# Patient Record
Sex: Female | Born: 1985 | Race: White | Hispanic: No | Marital: Married | Smoking: Never smoker
Health system: Southern US, Community
[De-identification: ages and names within clinical notes are randomized; demographics above are authoritative.]

## PROBLEM LIST (undated history)

## (undated) DIAGNOSIS — R519 Headache, unspecified: Secondary | ICD-10-CM

## (undated) DIAGNOSIS — N390 Urinary tract infection, site not specified: Secondary | ICD-10-CM

## (undated) DIAGNOSIS — R197 Diarrhea, unspecified: Secondary | ICD-10-CM

## (undated) DIAGNOSIS — K219 Gastro-esophageal reflux disease without esophagitis: Secondary | ICD-10-CM

## (undated) DIAGNOSIS — G8929 Other chronic pain: Secondary | ICD-10-CM

## (undated) DIAGNOSIS — D333 Benign neoplasm of cranial nerves: Secondary | ICD-10-CM

## (undated) DIAGNOSIS — F32A Depression, unspecified: Secondary | ICD-10-CM

## (undated) DIAGNOSIS — R109 Unspecified abdominal pain: Secondary | ICD-10-CM

## (undated) DIAGNOSIS — F329 Major depressive disorder, single episode, unspecified: Secondary | ICD-10-CM

## (undated) DIAGNOSIS — Z9889 Other specified postprocedural states: Secondary | ICD-10-CM

## (undated) DIAGNOSIS — R1115 Cyclical vomiting syndrome unrelated to migraine: Secondary | ICD-10-CM

## (undated) DIAGNOSIS — R112 Nausea with vomiting, unspecified: Secondary | ICD-10-CM

## (undated) DIAGNOSIS — R51 Headache: Secondary | ICD-10-CM

---
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
## 2007-03-10 ENCOUNTER — Emergency Department: Payer: Self-pay | Admitting: Emergency Medicine

## 2007-04-16 ENCOUNTER — Ambulatory Visit: Payer: Self-pay | Admitting: Certified Nurse Midwife

## 2007-09-22 ENCOUNTER — Inpatient Hospital Stay: Payer: Self-pay

## 2008-02-12 ENCOUNTER — Emergency Department: Payer: Self-pay | Admitting: Emergency Medicine

## 2008-02-15 ENCOUNTER — Emergency Department: Payer: Self-pay | Admitting: Emergency Medicine

## 2008-07-10 ENCOUNTER — Emergency Department: Payer: Self-pay | Admitting: Emergency Medicine

## 2008-07-30 ENCOUNTER — Emergency Department: Payer: Self-pay | Admitting: Emergency Medicine

## 2008-08-09 ENCOUNTER — Ambulatory Visit: Payer: Self-pay | Admitting: Emergency Medicine

## 2008-08-15 ENCOUNTER — Emergency Department: Payer: Self-pay | Admitting: Internal Medicine

## 2008-10-25 ENCOUNTER — Ambulatory Visit: Payer: Self-pay | Admitting: Certified Nurse Midwife

## 2009-03-26 ENCOUNTER — Ambulatory Visit: Payer: Self-pay | Admitting: Certified Nurse Midwife

## 2009-03-27 ENCOUNTER — Inpatient Hospital Stay: Payer: Self-pay | Admitting: Obstetrics and Gynecology

## 2009-05-18 ENCOUNTER — Emergency Department: Payer: Self-pay | Admitting: Emergency Medicine

## 2009-06-06 ENCOUNTER — Emergency Department: Payer: Self-pay | Admitting: Emergency Medicine

## 2010-04-28 ENCOUNTER — Emergency Department: Payer: Self-pay | Admitting: Emergency Medicine

## 2010-05-28 ENCOUNTER — Emergency Department: Payer: Self-pay | Admitting: Emergency Medicine

## 2010-06-20 ENCOUNTER — Emergency Department: Payer: Self-pay | Admitting: Internal Medicine

## 2010-07-26 ENCOUNTER — Emergency Department: Payer: Self-pay | Admitting: Emergency Medicine

## 2010-08-27 ENCOUNTER — Ambulatory Visit: Payer: Self-pay | Admitting: Family Medicine

## 2010-09-02 ENCOUNTER — Emergency Department: Payer: Self-pay | Admitting: Emergency Medicine

## 2010-10-24 ENCOUNTER — Ambulatory Visit: Payer: Self-pay | Admitting: Gastroenterology

## 2011-02-01 ENCOUNTER — Emergency Department: Payer: Self-pay | Admitting: Emergency Medicine

## 2011-09-11 ENCOUNTER — Emergency Department: Payer: Self-pay | Admitting: Emergency Medicine

## 2011-09-11 LAB — CBC
HCT: 39 % (ref 35.0–47.0)
HGB: 13.1 g/dL (ref 12.0–16.0)
MCH: 30.5 pg (ref 26.0–34.0)
RDW: 13.7 % (ref 11.5–14.5)

## 2011-09-11 LAB — URINALYSIS, COMPLETE
Bacteria: NONE SEEN
Ketone: NEGATIVE
Leukocyte Esterase: NEGATIVE
Specific Gravity: 1.014 (ref 1.003–1.030)
Squamous Epithelial: 3
WBC UR: 1 /HPF (ref 0–5)

## 2011-09-11 LAB — HCG, QUANTITATIVE, PREGNANCY: Beta Hcg, Quant.: 93 m[IU]/mL — ABNORMAL HIGH

## 2011-09-12 ENCOUNTER — Emergency Department: Payer: Self-pay | Admitting: *Deleted

## 2011-09-12 LAB — URINALYSIS, COMPLETE

## 2011-09-12 LAB — COMPREHENSIVE METABOLIC PANEL
Albumin: 3.9 g/dL (ref 3.4–5.0)
Anion Gap: 8 (ref 7–16)
Bilirubin,Total: 0.3 mg/dL (ref 0.2–1.0)
Co2: 28 mmol/L (ref 21–32)
EGFR (Non-African Amer.): >60
Glucose: 74 mg/dL (ref 65–99)
Osmolality: 280 (ref 275–301)
Potassium: 4.3 mmol/L (ref 3.5–5.1)
SGOT(AST): 26 U/L (ref 15–37)
SGPT (ALT): 26 U/L
Total Protein: 7.6 g/dL (ref 6.4–8.2)

## 2011-09-12 LAB — CBC
HCT: 38.7 % (ref 35.0–47.0)
HGB: 13 g/dL (ref 12.0–16.0)
MCHC: 33.7 g/dL (ref 32.0–36.0)
MCV: 91 fL (ref 80–100)
RDW: 13.8 % (ref 11.5–14.5)
WBC: 5.5 10*3/uL (ref 3.6–11.0)

## 2011-09-27 ENCOUNTER — Emergency Department: Payer: Self-pay | Admitting: *Deleted

## 2011-09-27 LAB — CBC WITH DIFFERENTIAL/PLATELET
Basophil #: 0 10*3/uL (ref 0.0–0.1)
Basophil %: 0.3 %
Eosinophil %: 0.6 %
MCV: 91 fL (ref 80–100)
Monocyte #: 0.6 x10 3/mm (ref 0.2–0.9)
Monocyte %: 4.6 %
Neutrophil #: 10.8 10*3/uL — ABNORMAL HIGH (ref 1.4–6.5)
RBC: 4.38 10*6/uL (ref 3.80–5.20)
RDW: 13.9 % (ref 11.5–14.5)
WBC: 13.2 10*3/uL — ABNORMAL HIGH (ref 3.6–11.0)

## 2011-09-27 LAB — COMPREHENSIVE METABOLIC PANEL
Albumin: 4 g/dL (ref 3.4–5.0)
Alkaline Phosphatase: 57 U/L (ref 50–136)
Chloride: 106 mmol/L (ref 98–107)
Co2: 24 mmol/L (ref 21–32)
EGFR (African American): >60
EGFR (Non-African Amer.): >60
Osmolality: 281 (ref 275–301)
SGOT(AST): 23 U/L (ref 15–37)
Total Protein: 7.2 g/dL (ref 6.4–8.2)

## 2011-09-27 LAB — LIPASE, BLOOD: Lipase: 113 U/L (ref 73–393)

## 2012-04-28 ENCOUNTER — Emergency Department: Payer: Self-pay | Admitting: Unknown Physician Specialty

## 2012-04-30 LAB — BETA STREP CULTURE(ARMC)

## 2013-07-11 ENCOUNTER — Emergency Department: Payer: Self-pay | Admitting: Emergency Medicine

## 2013-10-04 ENCOUNTER — Emergency Department: Payer: Self-pay | Admitting: Emergency Medicine

## 2014-05-10 ENCOUNTER — Emergency Department: Payer: Self-pay | Admitting: Emergency Medicine

## 2014-08-21 ENCOUNTER — Ambulatory Visit: Payer: Self-pay | Admitting: Physician Assistant

## 2014-08-28 ENCOUNTER — Emergency Department: Payer: Self-pay | Admitting: Emergency Medicine

## 2014-09-24 ENCOUNTER — Emergency Department
Admit: 2014-09-24 | Disposition: A | Discharge status: Home / Self Care | Payer: Self-pay | Admitting: Emergency Medicine

## 2015-01-02 ENCOUNTER — Emergency Department
Admission: EM | Admit: 2015-01-02 | Discharge: 2015-01-02 | Disposition: A | Discharge status: Home / Self Care | Payer: Medicaid Other | Attending: Emergency Medicine | Admitting: Emergency Medicine

## 2015-01-02 ENCOUNTER — Encounter: Payer: Self-pay | Admitting: Emergency Medicine

## 2015-01-02 DIAGNOSIS — K529 Noninfective gastroenteritis and colitis, unspecified: Secondary | ICD-10-CM | POA: Insufficient documentation

## 2015-01-02 DIAGNOSIS — R197 Diarrhea, unspecified: Secondary | ICD-10-CM | POA: Diagnosis present

## 2015-01-02 MED ORDER — ONDANSETRON 8 MG PO TBDP
8.0000 mg | ORAL_TABLET | Freq: Once | ORAL | Status: AC
  Administered 2015-01-02: 8 mg via ORAL

## 2015-01-02 MED ORDER — ONDANSETRON 8 MG PO TBDP
ORAL_TABLET | ORAL | Status: AC
  Administered 2015-01-02: 8 mg via ORAL
  Filled 2015-01-02: qty 1

## 2015-01-02 MED ORDER — ONDANSETRON HCL 4 MG PO TABS: 4.0000 mg | ORAL_TABLET | Freq: Every day | ORAL | Status: DC | PRN

## 2015-01-02 NOTE — ED Provider Notes (Signed)
[facility] Emergency Department Provider Note  ____________________________________________  Time seen: On arrival  I have reviewed the triage vital signs and the nursing notes.   HISTORY  Chief Complaint Emesis; Diarrhea; and Abdominal Pain    HPI Kathryn Valencia is a 29 y.o. female who reports she developed nausea and vomiting yesterday evening and then progressed to diarrhea overnight. She complains of mild abdominal cramping that is intermittent. She has chills but has not had a fever that she knows of. No bloody vomitus no blood in her stool. She reports her husband has similar symptoms over the last couple of days. No recent travel.She is tolerating liquids     History reviewed. No pertinent past medical history.  There are no active problems to display for this patient.   History reviewed. No pertinent past surgical history.  Current Outpatient Rx  Name  Route  Sig  Dispense  Refill  . ondansetron (ZOFRAN) 4 MG tablet   Oral   Take 1 tablet (4 mg total) by mouth daily as needed for nausea or vomiting.   20 tablet   1     Allergies Review of patient's allergies indicates no known allergies.  No family history on file.  Social History History  Substance Use Topics  . Smoking status: Never Smoker   . Smokeless tobacco: Not on file  . Alcohol Use: No    Review of Systems  Constitutional: Negative for fever. Positive for chills Eyes: Negative for visual changes. ENT: Negative for sore throat Cardiovascular: Negative for chest pain. Respiratory: Negative for shortness of breath. Gastrointestinal: As above Genitourinary: Negative for dysuria. Musculoskeletal: Negative for back pain. Skin: Negative for rash. Neurological: Negative for headaches or focal weakness Psychiatric: No anxiety  10-point ROS otherwise negative.  ____________________________________________   PHYSICAL EXAM:  VITAL SIGNS: ED Triage Vitals  Enc  Vitals Group     BP 01/02/15 0650 136/98 mmHg     Pulse Rate 01/02/15 0650 75     Resp 01/02/15 0650 18     Temp 01/02/15 0650 98.2 F (36.8 C)     Temp Source 01/02/15 0650 Oral     SpO2 01/02/15 0650 97 %     Weight 01/02/15 0650 260 lb (117.935 kg)     Height 01/02/15 0650 5\' 7"  (1.702 m)     Head Cir --      Peak Flow --      Pain Score 01/02/15 0652 10     Pain Loc --      Pain Edu? --      Excl. in [place]? --      Constitutional: Alert and oriented. Well appearing and in no distress. Eyes: Conjunctivae are normal.  ENT   Head: Normocephalic and atraumatic.   Mouth/Throat: Mucous membranes are moist. Cardiovascular: Normal rate, regular rhythm. Normal and symmetric distal pulses are present in all extremities. No murmurs, rubs, or gallops. Respiratory: Normal respiratory effort without tachypnea nor retractions. Breath sounds are clear and equal bilaterally.  Gastrointestinal: Soft and non-tender in all quadrants. No distention. There is no CVA tenderness. Genitourinary: deferred Musculoskeletal: Nontender with normal range of motion in all extremities. No lower extremity tenderness nor edema. Neurologic:  Normal speech and language. No gross focal neurologic deficits are appreciated. Skin:  Skin is warm, dry and intact. No rash noted. Psychiatric: Mood and affect are normal. Patient exhibits appropriate insight and judgment.  ____________________________________________    LABS (pertinent positives/negatives)  Labs Reviewed - No data to  display  ____________________________________________   EKG  None  ____________________________________________    RADIOLOGY I have personally reviewed any xrays that were ordered on this patient: None  ____________________________________________   PROCEDURES  Procedure(s) performed: none  Critical Care performed: none  ____________________________________________   INITIAL IMPRESSION / ASSESSMENT AND PLAN /  ED COURSE  Pertinent labs & imaging results that were available during my care of the patient were reviewed by me and considered in my medical decision making (see chart for details).  Patient well-appearing with normal vitals. No tenderness to palpation. History of present illness most consistent with viral gastroenteritis. We will give 8 of Zofran ODT and discharge home with instructions for supportive care. Return precautions discussed  ____________________________________________   FINAL CLINICAL IMPRESSION(S) / ED DIAGNOSES  Final diagnoses:  Gastroenteritis     Lavonia Drafts, MD 01/02/15 0730

## 2015-01-02 NOTE — Discharge Instructions (Signed)

## 2015-01-02 NOTE — ED Notes (Signed)
Patient ambulatory to triage with steady gait, without difficulty or distress noted; pt reports N/V/D since last night

## 2015-01-02 NOTE — ED Notes (Signed)
MD at bedside. 

## 2015-01-08 DIAGNOSIS — D333 Benign neoplasm of cranial nerves: Secondary | ICD-10-CM

## 2015-01-08 HISTORY — DX: Benign neoplasm of cranial nerves: D33.3

## 2015-01-11 ENCOUNTER — Other Ambulatory Visit: Payer: Self-pay | Admitting: Otolaryngology

## 2015-01-11 DIAGNOSIS — H903 Sensorineural hearing loss, bilateral: Secondary | ICD-10-CM

## 2015-01-11 DIAGNOSIS — H905 Unspecified sensorineural hearing loss: Secondary | ICD-10-CM

## 2015-01-18 ENCOUNTER — Ambulatory Visit
Admission: RE | Admit: 2015-01-18 | Discharge: 2015-01-18 | Disposition: A | Discharge status: Home / Self Care | Payer: Medicaid Other | Source: Ambulatory Visit | Attending: Otolaryngology | Admitting: Otolaryngology

## 2015-01-18 DIAGNOSIS — H9122 Sudden idiopathic hearing loss, left ear: Secondary | ICD-10-CM | POA: Diagnosis present

## 2015-01-18 DIAGNOSIS — D333 Benign neoplasm of cranial nerves: Secondary | ICD-10-CM | POA: Diagnosis not present

## 2015-01-18 DIAGNOSIS — H903 Sensorineural hearing loss, bilateral: Secondary | ICD-10-CM

## 2015-01-18 DIAGNOSIS — H905 Unspecified sensorineural hearing loss: Secondary | ICD-10-CM

## 2015-01-18 MED ORDER — GADOBENATE DIMEGLUMINE 529 MG/ML IV SOLN
20.0000 mL | Freq: Once | INTRAVENOUS | Status: AC | PRN
  Administered 2015-01-18: 20 mL via INTRAVENOUS

## 2015-01-27 ENCOUNTER — Emergency Department
Admission: EM | Admit: 2015-01-27 | Discharge: 2015-01-27 | Disposition: A | Discharge status: Home / Self Care | Payer: Medicaid Other | Attending: Emergency Medicine | Admitting: Emergency Medicine

## 2015-01-27 ENCOUNTER — Other Ambulatory Visit: Payer: Self-pay

## 2015-01-27 DIAGNOSIS — G43909 Migraine, unspecified, not intractable, without status migrainosus: Secondary | ICD-10-CM | POA: Insufficient documentation

## 2015-01-27 DIAGNOSIS — G43009 Migraine without aura, not intractable, without status migrainosus: Secondary | ICD-10-CM

## 2015-01-27 DIAGNOSIS — Z3202 Encounter for pregnancy test, result negative: Secondary | ICD-10-CM | POA: Insufficient documentation

## 2015-01-27 DIAGNOSIS — R51 Headache: Secondary | ICD-10-CM | POA: Diagnosis present

## 2015-01-27 HISTORY — DX: Other chronic pain: G89.29

## 2015-01-27 HISTORY — DX: Morbid (severe) obesity due to excess calories: E66.01

## 2015-01-27 HISTORY — DX: Benign neoplasm of cranial nerves: D33.3

## 2015-01-27 HISTORY — DX: Headache: R51

## 2015-01-27 HISTORY — DX: Headache, unspecified: R51.9

## 2015-01-27 LAB — URINALYSIS COMPLETE WITH MICROSCOPIC (ARMC ONLY)
Bilirubin Urine: NEGATIVE
GLUCOSE, UA: NEGATIVE mg/dL
KETONES UR: NEGATIVE mg/dL
NITRITE: NEGATIVE
Protein, ur: NEGATIVE mg/dL
SPECIFIC GRAVITY, URINE: 1.013 (ref 1.005–1.030)
pH: 6 (ref 5.0–8.0)

## 2015-01-27 LAB — BASIC METABOLIC PANEL
ANION GAP: 8 (ref 5–15)
BUN: 7 mg/dL (ref 6–20)
CALCIUM: 9 mg/dL (ref 8.9–10.3)
CO2: 21 mmol/L — ABNORMAL LOW (ref 22–32)
Chloride: 108 mmol/L (ref 101–111)
Creatinine, Ser: 0.72 mg/dL (ref 0.44–1.00)
GFR calc Af Amer: >60 mL/min (ref >60)
GLUCOSE: 98 mg/dL (ref 65–99)
Potassium: 3.3 mmol/L — ABNORMAL LOW (ref 3.5–5.1)
SODIUM: 137 mmol/L (ref 135–145)

## 2015-01-27 LAB — CBC
HCT: 39.7 % (ref 35.0–47.0)
HEMOGLOBIN: 13.5 g/dL (ref 12.0–16.0)
MCH: 30 pg (ref 26.0–34.0)
MCHC: 34 g/dL (ref 32.0–36.0)
MCV: 88.4 fL (ref 80.0–100.0)
Platelets: 208 10*3/uL (ref 150–440)
RBC: 4.5 MIL/uL (ref 3.80–5.20)
RDW: 13.7 % (ref 11.5–14.5)
WBC: 5.8 10*3/uL (ref 3.6–11.0)

## 2015-01-27 LAB — POCT PREGNANCY, URINE: Preg Test, Ur: NEGATIVE

## 2015-01-27 MED ORDER — DEXAMETHASONE SODIUM PHOSPHATE 10 MG/ML IJ SOLN
INTRAMUSCULAR | Status: AC
  Filled 2015-01-27: qty 1

## 2015-01-27 MED ORDER — DEXAMETHASONE SODIUM PHOSPHATE 10 MG/ML IJ SOLN
10.0000 mg | Freq: Once | INTRAMUSCULAR | Status: AC
  Administered 2015-01-27: 10 mg via INTRAVENOUS

## 2015-01-27 MED ORDER — METOCLOPRAMIDE HCL 5 MG/ML IJ SOLN
INTRAMUSCULAR | Status: AC
  Filled 2015-01-27: qty 2

## 2015-01-27 MED ORDER — METOCLOPRAMIDE HCL 5 MG/ML IJ SOLN
20.0000 mg | Freq: Once | INTRAVENOUS | Status: AC
  Administered 2015-01-27: 20 mg via INTRAVENOUS
  Filled 2015-01-27: qty 4

## 2015-01-27 MED ORDER — SODIUM CHLORIDE 0.9 % IV BOLUS (SEPSIS)
500.0000 mL | INTRAVENOUS | Status: AC
  Administered 2015-01-27: 500 mL via INTRAVENOUS

## 2015-01-27 MED ORDER — KETOROLAC TROMETHAMINE 30 MG/ML IJ SOLN
INTRAMUSCULAR | Status: AC
  Filled 2015-01-27: qty 1

## 2015-01-27 MED ORDER — KETOROLAC TROMETHAMINE 30 MG/ML IJ SOLN
30.0000 mg | Freq: Once | INTRAMUSCULAR | Status: AC
  Administered 2015-01-27: 30 mg via INTRAVENOUS

## 2015-01-27 MED ORDER — DIPHENHYDRAMINE HCL 50 MG/ML IJ SOLN
INTRAMUSCULAR | Status: AC
  Filled 2015-01-27: qty 1

## 2015-01-27 MED ORDER — DIPHENHYDRAMINE HCL 50 MG/ML IJ SOLN
25.0000 mg | Freq: Once | INTRAMUSCULAR | Status: AC
  Administered 2015-01-27: 25 mg via INTRAVENOUS

## 2015-01-27 NOTE — ED Notes (Signed)
Family at bedside. 

## 2015-01-27 NOTE — ED Notes (Signed)
Pt is complaining of headache, dizziness, ringing in ear on and off for about a month. 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

## 2015-01-27 NOTE — Discharge Instructions (Signed)
You have been seen in the Emergency Department (ED) for a we believe to be a migraine and not related to the small tumor it was recently found by Dr. Pryor Ochoa.  Please use Tylenol or Motrin as needed for symptoms, but only as written on the box, and take any regular medications that have been prescribed for you. As we have discussed, please follow up with your doctor as soon as possible regarding todays ED visit and your headache symptoms.    Call your doctor or return to the Emergency Department (ED) if you have a worsening headache, sudden and severe headache, confusion, slurred speech, facial droop, weakness or numbness in any arm or leg, extreme fatigue, or other symptoms that concern you.   Migraine Headache A migraine headache is very bad, throbbing pain on one or both sides of your head. Talk to your doctor about what things may bring on (trigger) your migraine headaches. HOME CARE  Only take medicines as told by your doctor.  Lie down in a dark, quiet room when you have a migraine.  Keep a journal to find out if certain things bring on migraine headaches. For example, write down:  What you eat and drink.  How much sleep you get.  Any change to your diet or medicines.  Lessen how much alcohol you drink.  Quit smoking if you smoke.  Get enough sleep.  Lessen any stress in your life.  Keep lights dim if bright lights bother you or make your migraines worse. GET HELP RIGHT AWAY IF:   Your migraine becomes really bad.  You have a fever.  You have a stiff neck.  You have trouble seeing.  Your muscles are weak, or you lose muscle control.  You lose your balance or have trouble walking.  You feel like you will pass out (faint), or you pass out.  You have really bad symptoms that are different than your first symptoms. MAKE SURE YOU:   Understand these instructions.  Will watch your condition.  Will get help right away if you are not doing well or get  worse. Document Released: 03/04/2008 Document Revised: 08/18/2011 Document Reviewed: 01/31/2013 [facility] Patient Information 2015 [place], [place]. This information is not intended to replace advice given to you by your health care provider. Make sure you discuss any questions you have with your health care provider.

## 2015-01-27 NOTE — ED Provider Notes (Signed)
[facility] Emergency Department [place] Note  ____________________________________________  Time seen: Approximately 6:04 PM  I have reviewed the triage vital signs and the nursing notes.   HISTORY  Chief Complaint Headache    HPI Kathryn Valencia is a 29 y.o. female with a medical history of obesity, chronic headaches, and recent diagnosis of an acoustic neuroma who presents with worsening left-sided headache.  She states that she has had a headache on and off for about a month as well as ringing in her left ear and occasional dizziness.  She was seen by Dr. Pryor Ochoa with ENT who ordered an MRI for her as an outpatient 9 days ago in which revealeda vestibular schwannoma/acoustic neuroma.  It is 4 x 8 mm.  I asked the patient what her follow-up plan is and she stated that they are referring her to a doctor in [place].  The pain has been constant, gradual in onset, and waxes and wanes in intensity from mild to severe.  She has not had any difficulty with her vision but does occasionally feel pain in her left eye as well.  SHe has had no numbness or tingling in her extremities.  She denies nausea, vomiting, chest pain, shortness of breath, abdominal pain, and dysuria.  Past Medical History  Diagnosis Date  . Morbid obesity   . 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 Chronic headaches     There are no active problems to display for this patient.   History reviewed. No pertinent past surgical history.  Current Outpatient Rx  Name  Route  Sig  Dispense  Refill  . ondansetron (ZOFRAN) 4 MG tablet   Oral   Take 1 tablet (4 mg total) by mouth daily as needed for nausea or vomiting.   20 tablet   1     Allergies Review of patient's allergies indicates no known allergies.  History reviewed. No pertinent family history.  Social History Social History  Substance Use Topics  . Smoking status: Never Smoker   . Smokeless tobacco: None  . Alcohol Use: No    Review of  Systems Constitutional: No fever/chills Eyes: No visual changes.occasional pain in her left eye ENT: No sore throat. Cardiovascular: Denies chest pain. Respiratory: Denies shortness of breath. Gastrointestinal: No abdominal pain.  No nausea, no vomiting.  No diarrhea.  No constipation. Genitourinary: Negative for dysuria. Musculoskeletal: Negative for back pain. Skin: Negative for rash. Neurological: persistent left-sided headache for about one month with tinnitus in her left ear  10-point ROS otherwise negative.  ____________________________________________   PHYSICAL EXAM:  VITAL SIGNS: ED Triage Vitals  Enc Vitals Group     BP 01/27/15 1332 109/69 mmHg     Pulse Rate 01/27/15 1332 72     Resp 01/27/15 1332 16     Temp 01/27/15 1332 97.5 F (36.4 C)     Temp Source 01/27/15 1332 Oral     SpO2 01/27/15 1332 99 %     Weight 01/27/15 1332 273 lb (123.832 kg)     Height 01/27/15 1332 5\' 7"  (1.702 m)     Head Cir --      Peak Flow --      Pain Score 01/27/15 1333 10     Pain Loc --      Pain Edu? --      Excl. in [place]? --     Constitutional: Alert and oriented. Well appearing and in no acute distress. Eyes: Conjunctivae are normal. PERRL. EOMI.  funduscopic exam is  normal with no evidence of papilledema Head: Atraumatic. Nose: No congestion/rhinnorhea. Mouth/Throat: Mucous membranes are moist.  Oropharynx non-erythematous. Neck: No stridor.   Cardiovascular: Normal rate, regular rhythm. Grossly normal heart sounds.  Good peripheral circulation. Respiratory: Normal respiratory effort.  No retractions. Lungs CTAB. Gastrointestinal: Soft and nontender. No distention. No abdominal bruits. No CVA tenderness. Musculoskeletal: No lower extremity tenderness nor edema.  No joint effusions. Neurologic:  Normal speech and language. No gross focal neurologic deficits are appreciated.  Skin:  Skin is warm, dry and intact. No rash noted. Psychiatric: Mood and affect are normal. Speech  and behavior are normal.  ____________________________________________   LABS (all labs ordered are listed, but only abnormal results are displayed)  Labs Reviewed  BASIC METABOLIC PANEL - Abnormal; Notable for the following:    Potassium 3.3 (*)    CO2 21 (*)    All other components within normal limits  URINALYSIS COMPLETEWITH MICROSCOPIC ([facility] ONLY) - Abnormal; Notable for the following:    Color, Urine YELLOW (*)    APPearance HAZY (*)    Hgb urine dipstick 1+ (*)    Leukocytes, UA 2+ (*)    Bacteria, UA RARE (*)    Squamous Epithelial / LPF 6-30 (*)    All other components within normal limits  CBC  POC URINE PREG, ED  POCT PREGNANCY, URINE   ____________________________________________  EKG  Not indicated ____________________________________________  RADIOLOGY  Not indicated ____________________________________________   PROCEDURES  Procedure(s) performed: None  Critical Care performed: No ____________________________________________   INITIAL IMPRESSION / ASSESSMENT AND PLAN / ED COURSE  Pertinent labs & imaging results that were available during my care of the patient were reviewed by me and considered in my medical decision making (see chart for details).  The patient seems to be having acute on chronic headache/migraine.  I discussed the findings on the MRI by phone with Dr. Tami Ribas who does not feel that the acoustic neuromas likely to be causing her headache.  Given her history of chronic headaches and the fact that this sounds very much like a typical migraine I will treat her for migraine with my usual migraine cocktail.  Though she is obese, I appreciate no papilledema on funduscopic exam and I do not believe that the risks outweigh the benefits in terms of doing a lumbar puncture to evaluate for pseudotumor cerebri.  I will reassess after treatment.  ----------------------------------------- 8:08 PM on  01/27/2015 -----------------------------------------  The patient states that she is feeling much better and that her headache is gone.  I am reassured by this and am even more strongly suspicious that this is chronic migraines.  I discussed this with her and recommended close outpatient follow-up.  I gave her my usual and customary return precautions.  ____________________________________________  FINAL CLINICAL IMPRESSION(S) / ED DIAGNOSES  Final diagnoses:  Nonintractable migraine, unspecified migraine type      NEW MEDICATIONS STARTED DURING THIS VISIT:  New Prescriptions   No medications on file     Hinda Kehr, MD 01/27/15 2011

## 2015-01-27 NOTE — ED Notes (Signed)
Pt is complaining of headache, dizziness, ringing in ear on and off for about a month. Patient states that she was "diagnosed with Aloustic Neuromd

## 2015-06-07 ENCOUNTER — Emergency Department
Admission: EM | Admit: 2015-06-07 | Discharge: 2015-06-07 | Disposition: A | Discharge status: Home / Self Care | Payer: Medicaid Other | Attending: Emergency Medicine | Admitting: Emergency Medicine

## 2015-06-07 DIAGNOSIS — R05 Cough: Secondary | ICD-10-CM | POA: Diagnosis present

## 2015-06-07 DIAGNOSIS — Z79899 Other long term (current) drug therapy: Secondary | ICD-10-CM | POA: Insufficient documentation

## 2015-06-07 DIAGNOSIS — J029 Acute pharyngitis, unspecified: Secondary | ICD-10-CM | POA: Insufficient documentation

## 2015-06-07 DIAGNOSIS — J069 Acute upper respiratory infection, unspecified: Secondary | ICD-10-CM | POA: Insufficient documentation

## 2015-06-07 MED ORDER — AMOXICILLIN 500 MG PO TABS: 500.0000 mg | ORAL_TABLET | Freq: Three times a day (TID) | ORAL | Status: DC

## 2015-06-07 MED ORDER — PREDNISONE 10 MG (21) PO TBPK: ORAL_TABLET | ORAL | Status: DC

## 2015-06-07 NOTE — ED Notes (Signed)
Pt c/o cough and congestion for the past 2 weeks, states she has been taking OTC meds without any relief

## 2015-06-07 NOTE — Discharge Instructions (Signed)

## 2015-06-07 NOTE — ED Notes (Signed)
Cold and cough for about 2 weeks ..having some discomfort to chest with cough and inspiration

## 2015-06-07 NOTE — ED Provider Notes (Signed)
[facility] Emergency Department Provider Note ____________________________________________  Time seen: Approximately 2:57 PM  I have reviewed the triage vital signs and the nursing notes.   HISTORY  Chief Complaint URI   HPI Kathryn Valencia is a 29 y.o. female who presents to the emergency department for evaluation of URI. Symptoms started about 2 weeks ago. No relief with OTC cold medications.    Past Medical History  Diagnosis Date  . Morbid obesity ([place])   . Acoustic neuroma ([place]) 01/2015  . Chronic headaches     There are no active problems to display for this patient.   History reviewed. No pertinent past surgical history.  Current Outpatient Rx  Name  Route  Sig  Dispense  Refill  . esomeprazole (NEXIUM) 40 MG capsule   Oral   Take 40 mg by mouth daily at 12 noon.         Marland Kitchen amoxicillin (AMOXIL) 500 MG tablet   Oral   Take 1 tablet (500 mg total) by mouth 3 (three) times daily.   30 tablet   0   . ondansetron (ZOFRAN) 4 MG tablet   Oral   Take 1 tablet (4 mg total) by mouth daily as needed for nausea or vomiting.   20 tablet   1   . predniSONE (STERAPRED UNI-PAK 21 TAB) 10 MG (21) TBPK tablet      Take 6 tablets on day 1 Take 5 tablets on day 2 Take 4 tablets on day 3 Take 3 tablets on day 4 Take 2 tablets on day 5 Take 1 tablet on day 6   21 tablet   0     Allergies Review of patient's allergies indicates no known allergies.  No family history on file.  Social History Social History  Substance Use Topics  . Smoking status: Never Smoker   . Smokeless tobacco: None  . Alcohol Use: No    Review of Systems Constitutional: No fever/chills Eyes: No visual changes. ENT: No sore throat. Cardiovascular: Denies chest pain. Respiratory: Occasional shortness of breath. Positive for cough. Gastrointestinal: No abdominal pain. No nausea,  no vomiting.  No diarrhea.  Genitourinary: Negative for dysuria. Musculoskeletal:  Negative for body aches Skin: Negative for rash. Neurological: Negative for headaches, Negative for focal weakness or numbness.  10-point ROS otherwise negative.  ____________________________________________   PHYSICAL EXAM:  VITAL SIGNS: ED Triage Vitals  Enc Vitals Group     BP 06/07/15 1049 113/59 mmHg     Pulse Rate 06/07/15 1049 79     Resp 06/07/15 1049 18     Temp 06/07/15 1049 98.4 F (36.9 C)     Temp Source 06/07/15 1049 Oral     SpO2 06/07/15 1049 98 %     Weight 06/07/15 1049 250 lb (113.399 kg)     Height 06/07/15 1049 5\' 7"  (1.702 m)     Head Cir --      Peak Flow --      Pain Score 06/07/15 1055 6     Pain Loc --      Pain Edu? --      Excl. in [place]? --     Constitutional: Alert and oriented. Well appearing and in no acute distress. Eyes: Conjunctivae are normal. PERRL. EOMI. Ears: Serous OM bilaterally. Head: Atraumatic. Nose: No congestion/rhinnorhea. Mouth/Throat: Mucous membranes are moist.  Oropharynx non-erythematous. Neck: No stridor.  Lymphatic: No cervical lymphadenopathy. Cardiovascular: Normal rate, regular rhythm. Grossly normal heart sounds.  Good peripheral circulation.  Respiratory: Normal respiratory effort.  No retractions. Lungs clear to auscultation. Gastrointestinal: Soft and nontender. No distention. No abdominal bruits. No CVA tenderness. Musculoskeletal: No joint pain reported. Neurologic:  Normal speech and language. No gross focal neurologic deficits are appreciated. Speech is normal. No gait instability. Skin:  Skin is warm, dry and intact. No rash noted. Psychiatric: Mood and affect are normal. Speech and behavior are normal.  ____________________________________________   LABS (all labs ordered are listed, but only abnormal results are displayed)  Labs Reviewed - No data to display ____________________________________________  EKG   ____________________________________________  RADIOLOGY  Not  indicated. ____________________________________________   PROCEDURES  Procedure(s) performed: None  Critical Care performed: No  ____________________________________________   INITIAL IMPRESSION / ASSESSMENT AND PLAN / ED COURSE  Pertinent labs & imaging results that were available during my care of the patient were reviewed by me and considered in my medical decision making (see chart for details).  Patient was advised to take the amoxicillin and prednisone until finished as prescribed. She was advised to follow up with the PCP of choice for symptoms that are not improving over the next few days. She was advised to return to the ER for symptoms that change or worsen if unable to schedule an appointment.  ____________________________________________   FINAL CLINICAL IMPRESSION(S) / ED DIAGNOSES  Final diagnoses:  Acute upper respiratory infection  Acute pharyngitis, unspecified etiology       Victorino Dike, FNP 06/07/15 1506  Nena Polio, MD 06/07/15 [phone]

## 2015-09-18 ENCOUNTER — Other Ambulatory Visit: Payer: Self-pay | Admitting: Physician Assistant

## 2015-09-18 ENCOUNTER — Ambulatory Visit
Admission: RE | Admit: 2015-09-18 | Discharge: 2015-09-18 | Disposition: A | Discharge status: Home / Self Care | Payer: Medicaid Other | Source: Ambulatory Visit | Attending: Physician Assistant | Admitting: Physician Assistant

## 2015-09-18 DIAGNOSIS — M79671 Pain in right foot: Secondary | ICD-10-CM | POA: Diagnosis not present

## 2015-09-18 DIAGNOSIS — T1490 Injury, unspecified: Secondary | ICD-10-CM | POA: Insufficient documentation

## 2015-09-18 DIAGNOSIS — W1830XA Fall on same level, unspecified, initial encounter: Secondary | ICD-10-CM | POA: Insufficient documentation

## 2015-10-01 ENCOUNTER — Encounter: Payer: Self-pay | Admitting: Emergency Medicine

## 2015-10-01 ENCOUNTER — Emergency Department
Admission: EM | Admit: 2015-10-01 | Discharge: 2015-10-01 | Disposition: A | Discharge status: Home / Self Care | Payer: Medicaid Other | Attending: Emergency Medicine | Admitting: Emergency Medicine

## 2015-10-01 DIAGNOSIS — W19XXXA Unspecified fall, initial encounter: Secondary | ICD-10-CM | POA: Insufficient documentation

## 2015-10-01 DIAGNOSIS — Z7952 Long term (current) use of systemic steroids: Secondary | ICD-10-CM | POA: Diagnosis not present

## 2015-10-01 DIAGNOSIS — S93601A Unspecified sprain of right foot, initial encounter: Secondary | ICD-10-CM

## 2015-10-01 DIAGNOSIS — D333 Benign neoplasm of cranial nerves: Secondary | ICD-10-CM | POA: Diagnosis not present

## 2015-10-01 DIAGNOSIS — Z792 Long term (current) use of antibiotics: Secondary | ICD-10-CM | POA: Insufficient documentation

## 2015-10-01 DIAGNOSIS — Y929 Unspecified place or not applicable: Secondary | ICD-10-CM | POA: Insufficient documentation

## 2015-10-01 DIAGNOSIS — M79671 Pain in right foot: Secondary | ICD-10-CM | POA: Diagnosis present

## 2015-10-01 DIAGNOSIS — Y939 Activity, unspecified: Secondary | ICD-10-CM | POA: Insufficient documentation

## 2015-10-01 DIAGNOSIS — Y999 Unspecified external cause status: Secondary | ICD-10-CM | POA: Insufficient documentation

## 2015-10-01 DIAGNOSIS — S93601S Unspecified sprain of right foot, sequela: Secondary | ICD-10-CM

## 2015-10-01 NOTE — ED Notes (Signed)
Pt was seen here after a fall a few weeks ago, injury to right foot, states the pain is still intense, ibuprofen is not helping with the pain per pt.

## 2015-10-01 NOTE — Discharge Instructions (Signed)
Cryotherapy Cryotherapy is when you put ice on your injury. Ice helps lessen pain and puffiness (swelling) after an injury. Ice works the best when you start using it in the first 24 to 48 hours after an injury. HOME CARE  Put a dry or damp towel between the ice pack and your skin.  You may press gently on the ice pack.  Leave the ice on for no more than 10 to 20 minutes at a time.  Check your skin after 5 minutes to make sure your skin is okay.  Rest at least 20 minutes between ice pack uses.  Stop using ice when your skin loses feeling (numbness).  Do not use ice on someone who cannot tell you when it hurts. This includes small children and people with memory problems (dementia). GET HELP RIGHT AWAY IF:  You have white spots on your skin.  Your skin turns blue or pale.  Your skin feels waxy or hard.  Your puffiness gets worse. MAKE SURE YOU:   Understand these instructions.  Will watch your condition.  Will get help right away if you are not doing well or get worse.   This information is not intended to replace advice given to you by your health care provider. Make sure you discuss any questions you have with your health care provider.   Document Released: 11/12/2007 Document Revised: 08/18/2011 Document Reviewed: 01/16/2011 Elsevier Interactive Patient Education 2016 [place] Sprain A foot sprain is an injury to one of the strong bands of tissue (ligaments) that connect and support the many bones in your feet. The ligament can be stretched too much or it can tear. A tear can be either partial or complete. The severity of the sprain depends on how much of the ligament was damaged or torn. CAUSES A foot sprain is usually caused by suddenly twisting or pivoting your foot. RISK FACTORS This injury is more likely to occur in people who:  Play a sport, such as basketball or football.  Exercise or play a sport without warming up.  Start a new workout or  sport.  Suddenly increase how long or hard they exercise or play a sport. SYMPTOMS Symptoms of this condition start soon after an injury and include:  Pain, especially in the arch of the foot.  Bruising.  Swelling.  Inability to walk or use the foot to support body weight. DIAGNOSIS This condition is diagnosed with a medical history and physical exam. You may also have imaging tests, such as:  X-rays to make sure there are no broken bones (fractures).  MRI to see if the ligament has torn. TREATMENT Treatment varies depending on the severity of your sprain. Mild sprains can be treated with rest, ice, compression, and elevation (RICE). If your ligament is overstretched or partially torn, treatment usually involves keeping your foot in a fixed position (immobilization) for a period of time. To help you do this, your health care provider will apply a bandage, splint, or walking boot to keep your foot from moving until it heals. You may also be advised to use crutches or a scooter for a few weeks to avoid bearing weight on your foot while it is healing. If your ligament is fully torn, you may need surgery to reconnect the ligament to the bone. After surgery, a cast or splint will be applied and will need to stay on your foot while it heals. Your health care provider may also suggest exercises or physical therapy to strengthen  your foot. HOME CARE INSTRUCTIONS If You Have a Bandage, Splint, or Walking Boot:  Wear it as directed by your health care provider. Remove it only as directed by your health care provider.  Loosen the bandage, splint, or walking boot if your toes become numb and tingle, or if they turn cold and blue. Bathing  If your health care provider approves bathing and showering, cover the bandage or splint with a watertight plastic bag to protect it from water. Do not let the bandage or splint get wet. Managing Pain, Stiffness, and Swelling   If directed, apply ice to the  injured area:  Put ice in a plastic bag.  Place a towel between your skin and the bag.  Leave the ice on for 20 minutes, 2-3 times per day.  Move your toes often to avoid stiffness and to lessen swelling.  Raise (elevate) the injured area above the level of your heart while you are sitting or lying down. Driving  Do not drive or operate heavy machinery while taking pain medicine.  Do not drive while wearing a bandage, splint, or walking boot on a foot that you use for driving. Activity  Rest as directed by your health care provider.  Do not use the injured foot to support your body weight until your health care provider says that you can. Use crutches or other supportive devices as directed by your health care provider.  Ask your health care provider what activities are safe for you. Gradually increase how much and how far you walk until your health care provider says it is safe to return to full activity.  Do any exercise or physical therapy as directed by your health care provider. General Instructions  If a splint was applied, do not put pressure on any part of it until it is fully hardened. This may take several hours.  Take medicines only as directed by your health care provider. These include over-the-counter medicines and prescription medicines.  Keep all follow-up visits as directed by your health care provider. This is important.  When you can walk without pain, wear supportive shoes that have stiff soles. Do not wear flip-flops, and do not walk barefoot. SEEK MEDICAL CARE IF:  Your pain is not controlled with medicine.  Your bruising or swelling gets worse or does not get better with treatment.  Your splint or walking boot is damaged. SEEK IMMEDIATE MEDICAL CARE IF:  Your foot is numb or blue.  Your foot feels colder than normal.   This information is not intended to replace advice given to you by your health care provider. Make sure you discuss any questions  you have with your health care provider.   Document Released: 11/15/2001 Document Revised: 10/10/2014 Document Reviewed: 03/29/2014 Elsevier Interactive Patient Education 2016 [place] the post-op shoe as directed. Follow-up with Dr. Vickki Muff for continued symptoms. Take the anti-inflammatory as needed.

## 2015-10-01 NOTE — ED Provider Notes (Signed)
[facility] Emergency Department Provider Note ____________________________________________  Time seen: 0913  I have reviewed the triage vital signs and the nursing notes.  HISTORY  Chief Complaint  Foot Pain  HPI Kathryn Valencia is a 30 y.o. female patient presents to the ED for evaluation of continued pain to the right foot following a mechanical injury6 weeks prior to arrival. The patient was initially evaluated here on 4/11, one month after the initial injury. She was evaluated with x-rays which did not confirm any acute bony changes. There was remark about a remote fracture, that the patient was otherwise unaware of. She presents today under the impression that she had an acute bony injury when she was evaluated 2 weeks ago by her primary care provider. She reports continued pain intermittently to the right foot and minimal benefit with dosing ibuprofen. She denies any reinjury or interim trauma limited to the right foot. She has been wearing tennis shoes and walking on the heel of her foot for comfort. She rates her pain at an 8/10 in triage.  Past Medical History  Diagnosis Date  . Morbid obesity ([place])   . 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 Chronic headaches     There are no active problems to display for this patient.   History reviewed. No pertinent past surgical history.  Current Outpatient Rx  Name  Route  Sig  Dispense  Refill  . amoxicillin (AMOXIL) 500 MG tablet   Oral   Take 1 tablet (500 mg total) by mouth 3 (three) times daily.   30 tablet   0   . esomeprazole (NEXIUM) 40 MG capsule   Oral   Take 40 mg by mouth daily at 12 noon.         . ondansetron (ZOFRAN) 4 MG tablet   Oral   Take 1 tablet (4 mg total) by mouth daily as needed for nausea or vomiting.   20 tablet   1   . predniSONE (STERAPRED UNI-PAK 21 TAB) 10 MG (21) TBPK tablet      Take 6 tablets on day 1 Take 5 tablets on day 2 Take 4 tablets on day 3 Take 3 tablets on  day 4 Take 2 tablets on day 5 Take 1 tablet on day 6   21 tablet   0    Allergies Review of patient's allergies indicates no known allergies.  No family history on file.  Social History Social History  Substance Use Topics  . Smoking status: Never Smoker   . Smokeless tobacco: None  . Alcohol Use: No   Review of Systems  Constitutional: Negative for fever. Musculoskeletal: Negative for back pain. Right foot pain as above Skin: Negative for rash. Neurological: Negative for headaches, focal weakness or numbness. ____________________________________________  PHYSICAL EXAM:  VITAL SIGNS: ED Triage Vitals  Enc Vitals Group     BP 10/01/15 0908 122/73 mmHg     Pulse Rate 10/01/15 0908 75     Resp 10/01/15 0908 18     Temp 10/01/15 0908 98.1 F (36.7 C)     Temp Source 10/01/15 0908 Oral     SpO2 10/01/15 0908 100 %     Weight 10/01/15 0908 283 lb (128.368 kg)     Height 10/01/15 0908 5\' 7"  (1.702 m)     Head Cir --      Peak Flow --      Pain Score 10/01/15 0909 8     Pain Loc --  Pain Edu? --      Excl. in [place]? --    Constitutional: Alert and oriented. Well appearing and in no distress. Head: Normocephalic and atraumatic. Cardiovascular: Normal distal pulses and capillary refill. Respiratory: Normal respiratory effort.  Musculoskeletal: Right foot without obvious deformity, ecchymosis, or swelling. Minimal tenderness to palp over the lateral right foot. Ankle exam unremarkable. Nontender with normal range of motion in all other extremities.  Neurologic:  Normal gait without ataxia. Normal speech and language. No gross focal neurologic deficits are appreciated. Skin:  Skin is warm, dry and intact. No rash noted. ____________________________________________   RADIOLOGY  Right Foot (09/18/2015) IMPRESSION: Mild cortical irregularity of the base of the fifth metatarsal. This could represent old healed fracture. No acute abnormality identified.  I, Gilad Dugger,  Dannielle Karvonen, personally viewed and evaluated these images (plain radiographs) as part of my medical decision making, as well as reviewing the written report by the radiologist. ____________________________________________  PROCEDURES  Post-op shoe ____________________________________________  INITIAL IMPRESSION / ASSESSMENT AND PLAN / ED COURSE  Patient with a right foot sprain and continued pain following injury initial eval. X-rays reviewed, which confirm an old bony irregularity, not likely consistent with the patient's more acute injury. Patient is placed in a postop shoe and given instructions on management of a foot sprain. She will follow with her primary care provider for ongoing symptom management. ____________________________________________  FINAL CLINICAL IMPRESSION(S) / ED DIAGNOSES  Final diagnoses:  Foot sprain, right, sequela  Foot sprain, right, initial encounter     Melvenia Needles, PA-C 10/03/15 0040  Schuyler Amor, MD 10/04/15 2007

## 2015-10-01 NOTE — ED Notes (Signed)
Pt alert and oriented X4, active, cooperative, pt in NAD. RR even and unlabored, color WNL.  Pt informed to return if any life threatening symptoms occur.   

## 2016-02-23 ENCOUNTER — Encounter: Payer: Self-pay | Admitting: Emergency Medicine

## 2016-02-23 ENCOUNTER — Emergency Department
Admission: EM | Admit: 2016-02-23 | Discharge: 2016-02-23 | Disposition: A | Discharge status: Home / Self Care | Payer: Medicaid Other | Attending: Emergency Medicine | Admitting: Emergency Medicine

## 2016-02-23 DIAGNOSIS — R55 Syncope and collapse: Secondary | ICD-10-CM | POA: Diagnosis not present

## 2016-02-23 DIAGNOSIS — Z792 Long term (current) use of antibiotics: Secondary | ICD-10-CM | POA: Insufficient documentation

## 2016-02-23 DIAGNOSIS — Z79899 Other long term (current) drug therapy: Secondary | ICD-10-CM | POA: Insufficient documentation

## 2016-02-23 DIAGNOSIS — R42 Dizziness and giddiness: Secondary | ICD-10-CM | POA: Diagnosis present

## 2016-02-23 LAB — URINALYSIS COMPLETE WITH MICROSCOPIC (ARMC ONLY)
Bilirubin Urine: NEGATIVE
GLUCOSE, UA: NEGATIVE mg/dL
HGB URINE DIPSTICK: NEGATIVE
Ketones, ur: NEGATIVE mg/dL
Nitrite: NEGATIVE
Protein, ur: NEGATIVE mg/dL
Specific Gravity, Urine: 1.013 (ref 1.005–1.030)
pH: 6 (ref 5.0–8.0)

## 2016-02-23 LAB — BASIC METABOLIC PANEL
Anion gap: 7 (ref 5–15)
BUN: 7 mg/dL (ref 6–20)
CALCIUM: 8.3 mg/dL — AB (ref 8.9–10.3)
CO2: 21 mmol/L — ABNORMAL LOW (ref 22–32)
CREATININE: 0.65 mg/dL (ref 0.44–1.00)
Chloride: 111 mmol/L (ref 101–111)
GFR calc Af Amer: >60 mL/min (ref >60)
GLUCOSE: 94 mg/dL (ref 65–99)
Potassium: 3.5 mmol/L (ref 3.5–5.1)
Sodium: 139 mmol/L (ref 135–145)

## 2016-02-23 LAB — POCT PREGNANCY, URINE: PREG TEST UR: NEGATIVE

## 2016-02-23 LAB — CBC
HCT: 44.1 % (ref 35.0–47.0)
Hemoglobin: 15.4 g/dL (ref 12.0–16.0)
MCH: 31.1 pg (ref 26.0–34.0)
MCHC: 34.9 g/dL (ref 32.0–36.0)
MCV: 89.1 fL (ref 80.0–100.0)
PLATELETS: 193 10*3/uL (ref 150–440)
RBC: 4.95 MIL/uL (ref 3.80–5.20)
RDW: 13.2 % (ref 11.5–14.5)
WBC: 7.9 10*3/uL (ref 3.6–11.0)

## 2016-02-23 LAB — TROPONIN I: Troponin I: <0.03 ng/mL (ref <0.03)

## 2016-02-23 MED ORDER — SODIUM CHLORIDE 0.9 % IV BOLUS (SEPSIS)
1000.0000 mL | Freq: Once | INTRAVENOUS | Status: AC
  Administered 2016-02-23: 1000 mL via INTRAVENOUS

## 2016-02-23 NOTE — ED Provider Notes (Signed)
[facility] Emergency Department Provider Note  Time seen: 1:06 PM  I have reviewed the triage vital signs and the nursing notes.   HISTORY  Chief Complaint No chief complaint on file.    HPI Kathryn Valencia is a 30 y.o. female who presents to the emergency department with near syncope. According to the patient she was at a festival walking outside when she began feeling very lightheaded and dizzy. Patient states he felt like she was going to pass out she laid down on the ground. Took 2 people to help her stand up. Patient denies actually passing out. Patient states she has not eaten or drinking anything today, has not eaten since 5 PM yesterday. Patient denies any history of syncope in the past. Denies any chest pain at any point. Denies any trouble breathing. Patient does states she felt somewhat nauseated during the event but denies vomiting. Denies any recent illness.  Past Medical History:  Diagnosis Date  . 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 Chronic headaches   . Morbid obesity ([place])     There are no active problems to display for this patient.   No past surgical history on file.  Prior to Admission medications   Medication Sig Start Date End Date Taking? Authorizing Provider  amoxicillin (AMOXIL) 500 MG tablet Take 1 tablet (500 mg total) by mouth 3 (three) times daily. 06/07/15   Victorino Dike, FNP  esomeprazole (NEXIUM) 40 MG capsule Take 40 mg by mouth daily at 12 noon.    Historical Provider, MD  ondansetron (ZOFRAN) 4 MG tablet Take 1 tablet (4 mg total) by mouth daily as needed for nausea or vomiting. 01/02/15   Lavonia Drafts, MD  predniSONE (STERAPRED UNI-PAK 21 TAB) 10 MG (21) TBPK tablet Take 6 tablets on day 1 Take 5 tablets on day 2 Take 4 tablets on day 3 Take 3 tablets on day 4 Take 2 tablets on day 5 Take 1 tablet on day 6 06/07/15   Victorino Dike, FNP    No Known Allergies  No family history on file.  Social History Social  History  Substance Use Topics  . Smoking status: Never Smoker  . Smokeless tobacco: Not on file  . Alcohol use No    Review of Systems Constitutional: Negative for fever. Cardiovascular: Negative for chest pain. Respiratory: Negative for shortness of breath. Gastrointestinal: Negative for abdominal pain Musculoskeletal: Negative for back pain. Neurological: Negative for headache 10-point ROS otherwise negative.  ____________________________________________   PHYSICAL EXAM:  Constitutional: Alert and oriented. Well appearing and in no distress. Eyes: Normal exam ENT   Head: Normocephalic and atraumatic.   Mouth/Throat: Mucous membranes are moist. Cardiovascular: Normal rate, regular rhythm. No murmur Respiratory: Normal respiratory effort without tachypnea nor retractions. Breath sounds are clear  Gastrointestinal: Soft and nontender. No distention.   Musculoskeletal: Nontender with normal range of motion in all extremities.  Neurologic:  Normal speech and language. No gross focal neurologic deficits  Skin:  Skin is warm, dry and intact.  Psychiatric: Mood and affect are normal. Speech and behavior are normal.   ____________________________________________    EKG  EKG reviewed and interpreted by myself shows normal sinus rhythm at 87 bpm, narrow QRS, normal axis, normal intervals, no concerning ST changes.  ____________________________________________    INITIAL IMPRESSION / ASSESSMENT AND PLAN / ED COURSE  Pertinent labs & imaging results that were available during my care of the patient were reviewed by me and considered in my medical  decision making (see chart for details).  The patient presents the emergency department after a near syncopal episode. Patient admits to walking around in the sun, states she was feeling hot but then became very lightheaded and dizzy and developed generalized weakness. Denies any chest pain at any point. Patient states she is  feeling much better, received 500 cc of normal saline by EMS. Normal finger stick blood glucose. We will check labs, EKG, IV hydrate, and closely monitor in the emergency department.  Patient's labs are normal. EKG is reassuring. Patient states she is feeling much better. I suspect the degree of dehydration, along with the patient not eating today and being out in the heat they cause her to feel near syncopal. As the patient is feeling better now we will discharge home. I discussed increasing oral fluids and rest today along with PCP follow-up Monday. Patient agreeable. ____________________________________________   FINAL CLINICAL IMPRESSION(S) / ED DIAGNOSES  Near-syncope    Harvest Dark, MD 02/23/16 [phone]

## 2016-02-23 NOTE — ED Triage Notes (Signed)
Pt presents to the ED via EMS with c/o near syncope. Pt was at a festival when she became lightheaded and almost passed out. Pt states she hasn't eaten since yesterday and has only drank a small amount of fluids today. Pt also donated plasma this morning. Pt is alert and oriented at this time.

## 2016-04-16 ENCOUNTER — Emergency Department
Admission: EM | Admit: 2016-04-16 | Discharge: 2016-04-16 | Disposition: A | Discharge status: Home / Self Care | Payer: Medicaid Other | Attending: Emergency Medicine | Admitting: Emergency Medicine

## 2016-04-16 ENCOUNTER — Encounter: Payer: Self-pay | Admitting: Emergency Medicine

## 2016-04-16 DIAGNOSIS — J029 Acute pharyngitis, unspecified: Secondary | ICD-10-CM | POA: Diagnosis present

## 2016-04-16 DIAGNOSIS — J069 Acute upper respiratory infection, unspecified: Secondary | ICD-10-CM | POA: Diagnosis not present

## 2016-04-16 DIAGNOSIS — B9789 Other viral agents as the cause of diseases classified elsewhere: Secondary | ICD-10-CM

## 2016-04-16 LAB — POCT RAPID STREP A: Streptococcus, Group A Screen (Direct): NEGATIVE

## 2016-04-16 MED ORDER — BENZONATATE 100 MG PO CAPS
200.0000 mg | ORAL_CAPSULE | Freq: Once | ORAL | Status: AC
  Administered 2016-04-16: 200 mg via ORAL
  Filled 2016-04-16: qty 2

## 2016-04-16 MED ORDER — PSEUDOEPH-BROMPHEN-DM 30-2-10 MG/5ML PO SYRP: 5.0000 mL | ORAL_SOLUTION | Freq: Four times a day (QID) | ORAL | 0 refills | Status: DC | PRN

## 2016-04-16 MED ORDER — KETOROLAC TROMETHAMINE 60 MG/2ML IM SOLN
60.0000 mg | Freq: Once | INTRAMUSCULAR | Status: AC
  Administered 2016-04-16: 60 mg via INTRAMUSCULAR
  Filled 2016-04-16: qty 2

## 2016-04-16 MED ORDER — IBUPROFEN 800 MG PO TABS: 800.0000 mg | ORAL_TABLET | Freq: Three times a day (TID) | ORAL | 0 refills | Status: DC | PRN

## 2016-04-16 NOTE — ED Triage Notes (Signed)
Pt presents with cough and sore throat for two days

## 2016-04-16 NOTE — ED Provider Notes (Signed)
[facility] Emergency Department Provider Note   ____________________________________________   First MD Initiated Contact with Patient 04/16/16 1247     (approximate)  I have reviewed the triage vital signs and the nursing notes.   HISTORY  Chief Complaint Cough and Sore Throat    HPI Kathryn Valencia is a 30 y.o. female patient complaining of cough and sore throat for 2 days. Patient also states she's has nausea and vomiting but denies diarrhea. Patient states took the flu injection last week. No palliative measures for this complaint. Patient is also complaining of body ache and headache.   Past Medical History:  Diagnosis Date  . 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 Chronic headaches   . Morbid obesity ([place])     There are no active problems to display for this patient.   History reviewed. No pertinent surgical history.  Prior to Admission medications   Medication Sig Start Date End Date Taking? Authorizing Provider  amoxicillin (AMOXIL) 500 MG tablet Take 1 tablet (500 mg total) by mouth 3 (three) times daily. 06/07/15   Victorino Dike, FNP  brompheniramine-pseudoephedrine-DM 30-2-10 MG/5ML syrup Take 5 mLs by mouth 4 (four) times daily as needed. 04/16/16   Sable Feil, PA-C  esomeprazole (NEXIUM) 40 MG capsule Take 40 mg by mouth daily at 12 noon.    Historical Provider, MD  ibuprofen (ADVIL,MOTRIN) 800 MG tablet Take 1 tablet (800 mg total) by mouth every 8 (eight) hours as needed for moderate pain. 04/16/16   Sable Feil, PA-C  ondansetron (ZOFRAN) 4 MG tablet Take 1 tablet (4 mg total) by mouth daily as needed for nausea or vomiting. 01/02/15   Lavonia Drafts, MD  predniSONE (STERAPRED UNI-PAK 21 TAB) 10 MG (21) TBPK tablet Take 6 tablets on day 1 Take 5 tablets on day 2 Take 4 tablets on day 3 Take 3 tablets on day 4 Take 2 tablets on day 5 Take 1 tablet on day 6 06/07/15   Victorino Dike, FNP    Allergies Patient has no known  allergies.  No family history on file.  Social History Social History  Substance Use Topics  . Smoking status: Never Smoker  . Smokeless tobacco: Never Used  . Alcohol use No    Review of Systems Constitutional: No fever/chills.  Eyes: No visual changes. ENT: Sore throat Cardiovascular: Denies chest pain. Respiratory: Denies shortness of breath. Nonproductive cough. Gastrointestinal: No abdominal pain.  No nausea, no vomiting.  No diarrhea.  No constipation. Genitourinary: Negative for dysuria. Musculoskeletal: Negative for back pain. Skin: Negative for rash. Neurological: Positive for headaches, but denies focal weakness or numbness.    ____________________________________________   PHYSICAL EXAM:  VITAL SIGNS: ED Triage Vitals [04/16/16 1222]  Enc Vitals Group     BP 120/86     Pulse Rate 67     Resp 20     Temp 97.9 F (36.6 C)     Temp Source Oral     SpO2 100 %     Weight 282 lb (127.9 kg)     Height 5\' 7"  (1.702 m)     Head Circumference      Peak Flow      Pain Score 8     Pain Loc      Pain Edu?      Excl. in [place]?     Constitutional: Alert and oriented. Well appearing and in no acute distress. Morbid obesity Eyes: Conjunctivae are normal. PERRL. EOMI. Head:  Atraumatic. Nose:Edematous nasal turbinates clear rhinorrhea Mouth/Throat: Mucous membranes are moist.  Oropharynx non-erythematous. Neck: No stridor.  No cervical spine tenderness to palpation. Hematological/Lymphatic/Immunilogical: No cervical lymphadenopathy. Cardiovascular: Normal rate, regular rhythm. Grossly normal heart sounds.  Good peripheral circulation. Respiratory: Normal respiratory effort.  No retractions. Lungs CTAB. Gastrointestinal: Soft and nontender. No distention. No abdominal bruits. No CVA tenderness. Musculoskeletal: No lower extremity tenderness nor edema.  No joint effusions. Neurologic:  Normal speech and language. No gross focal neurologic deficits are appreciated. No  gait instability. Skin:  Skin is warm, dry and intact. No rash noted. Psychiatric: Mood and affect are normal. Speech and behavior are normal.  ____________________________________________   LABS (all labs ordered are listed, but only abnormal results are displayed)  Labs Reviewed  POCT RAPID STREP A   ____________________________________________  EKG   ____________________________________________  RADIOLOGY   ____________________________________________   PROCEDURES  Procedure(s) performed: None  Procedures  Critical Care performed: No  ____________________________________________   INITIAL IMPRESSION / ASSESSMENT AND PLAN / ED COURSE  Pertinent labs & imaging results that were available during my care of the patient were reviewed by me and considered in my medical decision making (see chart for details).  Viral illness. Patient given discharge Instructions. Patient work note for 2 days. Patient given a prescription for [facility] felt DM and ibuprofen.  Clinical Course   Discussed negative rapid strep test patient advised to culture is pending.   ____________________________________________   FINAL CLINICAL IMPRESSION(S) / ED DIAGNOSES  Final diagnoses:  Sore throat  Viral URI with cough      NEW MEDICATIONS STARTED DURING THIS VISIT:  New Prescriptions   BROMPHENIRAMINE-PSEUDOEPHEDRINE-DM 30-2-10 MG/5ML SYRUP    Take 5 mLs by mouth 4 (four) times daily as needed.   IBUPROFEN (ADVIL,MOTRIN) 800 MG TABLET    Take 1 tablet (800 mg total) by mouth every 8 (eight) hours as needed for moderate pain.     Note:  This document was prepared using Dragon voice recognition software and may include unintentional dictation errors.    Sable Feil, PA-C 04/16/16 1322    Harvest Dark, MD 04/16/16 [phone]

## 2016-04-16 NOTE — ED Notes (Signed)
See triage note  States she developed sore throat and cough about 3 days ago afebrile on arrival to ED   No resp distress noted

## 2016-04-19 LAB — CULTURE, GROUP A STREP (THRC)

## 2016-06-05 ENCOUNTER — Emergency Department
Admission: EM | Admit: 2016-06-05 | Discharge: 2016-06-05 | Disposition: A | Discharge status: Home / Self Care | Payer: Medicaid Other | Attending: Emergency Medicine | Admitting: Emergency Medicine

## 2016-06-05 DIAGNOSIS — R519 Headache, unspecified: Secondary | ICD-10-CM

## 2016-06-05 DIAGNOSIS — Z791 Long term (current) use of non-steroidal anti-inflammatories (NSAID): Secondary | ICD-10-CM | POA: Diagnosis not present

## 2016-06-05 DIAGNOSIS — Z79899 Other long term (current) drug therapy: Secondary | ICD-10-CM | POA: Insufficient documentation

## 2016-06-05 DIAGNOSIS — R42 Dizziness and giddiness: Secondary | ICD-10-CM | POA: Diagnosis present

## 2016-06-05 DIAGNOSIS — R51 Headache: Secondary | ICD-10-CM | POA: Diagnosis not present

## 2016-06-05 LAB — BASIC METABOLIC PANEL
ANION GAP: 8 (ref 5–15)
BUN: 8 mg/dL (ref 6–20)
CO2: 25 mmol/L (ref 22–32)
Calcium: 9.4 mg/dL (ref 8.9–10.3)
Chloride: 106 mmol/L (ref 101–111)
Creatinine, Ser: 0.66 mg/dL (ref 0.44–1.00)
GFR calc Af Amer: >60 mL/min (ref >60)
GLUCOSE: 88 mg/dL (ref 65–99)
POTASSIUM: 3.6 mmol/L (ref 3.5–5.1)
SODIUM: 139 mmol/L (ref 135–145)

## 2016-06-05 LAB — CBC
HCT: 39.1 % (ref 35.0–47.0)
Hemoglobin: 13.1 g/dL (ref 12.0–16.0)
MCH: 30.8 pg (ref 26.0–34.0)
MCHC: 33.5 g/dL (ref 32.0–36.0)
MCV: 92.1 fL (ref 80.0–100.0)
PLATELETS: 206 10*3/uL (ref 150–440)
RBC: 4.25 MIL/uL (ref 3.80–5.20)
RDW: 13.6 % (ref 11.5–14.5)
WBC: 6.4 10*3/uL (ref 3.6–11.0)

## 2016-06-05 LAB — TROPONIN I

## 2016-06-05 MED ORDER — BUTALBITAL-APAP-CAFFEINE 50-325-40 MG PO TABS
2.0000 | ORAL_TABLET | ORAL | Status: AC
  Administered 2016-06-05: 2 via ORAL
  Filled 2016-06-05 (×2): qty 2

## 2016-06-05 NOTE — ED Provider Notes (Signed)
[facility] Emergency Department Provider Note  ____________________________________________   First MD Initiated Contact with Patient 06/05/16 2059     (approximate)  I have reviewed the triage vital signs and the nursing notes.   HISTORY  Chief Complaint Dizziness    HPI Kathryn Valencia is a 30 y.o. female with a history of chronic headaches and a prior diagnosis of acoustic neuroma who presents for evaluation of intermittent dizziness and headaches for about a week.  She states that it is similar to symptoms she has had in the past.  She was diagnosed with a year ago by ENT with the acoustic neuroma and then followed up with a ENT provider in [place].  She has not followed up with them since that time.  She states that she was told that if she looked any new or worsening symptoms that she should get an additional MRI evaluation.  She consider following up as an outpatient and the clinic but thought maybe she should come to the emergency department.  She states she is not having any difficulty with ambulation.  She describes her headache is mild, posterior, and solar prior headaches.  She denies photophobia, nausea, vomiting, abdominal pain, chest pain, shortness of breath, dysuria, fever/chills, neck pain.  Nothing in particular makes her symptoms better nor worse.  She has had no numbness or tingling or weakness in any of her extremities.   Past Medical History:  Diagnosis Date  . Acoustic neuroma ([place]) 01/2015  . Chronic headaches   . Morbid obesity ([place])     There are no active problems to display for this patient.   No past surgical history on file.  Prior to Admission medications   Medication Sig Start Date End Date Taking? Authorizing Provider  amoxicillin (AMOXIL) 500 MG tablet Take 1 tablet (500 mg total) by mouth 3 (three) times daily. 06/07/15   Victorino Dike, FNP  brompheniramine-pseudoephedrine-DM 30-2-10 MG/5ML syrup Take 5 mLs by  mouth 4 (four) times daily as needed. 04/16/16   Sable Feil, PA-C  esomeprazole (NEXIUM) 40 MG capsule Take 40 mg by mouth daily at 12 noon.    Historical Provider, MD  ibuprofen (ADVIL,MOTRIN) 800 MG tablet Take 1 tablet (800 mg total) by mouth every 8 (eight) hours as needed for moderate pain. 04/16/16   Sable Feil, PA-C  ondansetron (ZOFRAN) 4 MG tablet Take 1 tablet (4 mg total) by mouth daily as needed for nausea or vomiting. 01/02/15   Lavonia Drafts, MD  predniSONE (STERAPRED UNI-PAK 21 TAB) 10 MG (21) TBPK tablet Take 6 tablets on day 1 Take 5 tablets on day 2 Take 4 tablets on day 3 Take 3 tablets on day 4 Take 2 tablets on day 5 Take 1 tablet on day 6 06/07/15   Victorino Dike, FNP    Allergies Patient has no known allergies.  No family history on file.  Social History Social History  Substance Use Topics  . Smoking status: Never Smoker  . Smokeless tobacco: Never Used  . Alcohol use No    Review of Systems Constitutional: No fever/chills Eyes: No visual changes. ENT: No sore throat. Cardiovascular: Denies chest pain. Respiratory: Denies shortness of breath. Gastrointestinal: No abdominal pain.  No nausea, no vomiting.  No diarrhea.  No constipation. Genitourinary: Negative for dysuria. Musculoskeletal: Negative for back pain. Skin: Negative for rash. Neurological: Posterior headache.  Intermittent dizziness.   10-point ROS otherwise negative.  ____________________________________________   PHYSICAL EXAM:  VITAL  SIGNS: ED Triage Vitals  Enc Vitals Group     BP 06/05/16 1919 124/75     Pulse Rate 06/05/16 1919 (!) 57     Resp 06/05/16 1919 18     Temp 06/05/16 1919 97.9 F (36.6 C)     Temp Source 06/05/16 1919 Oral     SpO2 06/05/16 1919 100 %     Weight 06/05/16 1920 280 lb (127 kg)     Height 06/05/16 1920 5\' 7"  (1.702 m)     Head Circumference --      Peak Flow --      Pain Score 06/05/16 1920 7     Pain Loc --      Pain Edu? --       Excl. in [place]? --     Constitutional: Alert and oriented. Well appearing and in no acute distress. Eyes: Conjunctivae are normal. PERRL. EOMI.  No nystagmus. Head: Atraumatic. Nose: No congestion/rhinnorhea. Mouth/Throat: Mucous membranes are moist.  Oropharynx non-erythematous. Neck: No stridor.  No meningeal signs.   Cardiovascular: Normal rate, regular rhythm. Good peripheral circulation. Grossly normal heart sounds. Respiratory: Normal respiratory effort.  No retractions. Lungs CTAB. Gastrointestinal: Soft and nontender. No distention.  Musculoskeletal: No lower extremity tenderness nor edema. No gross deformities of extremities. Neurologic:  Normal speech and language. No gross focal neurologic deficits are appreciated. No dysmetria, no gait instability, no facial droop, no dysarthria.  Good muscle strength throughout upper and lower extremities. Skin:  Skin is warm, dry and intact. No rash noted. Psychiatric: Mood and affect are normal. Speech and behavior are normal.  ____________________________________________   LABS (all labs ordered are listed, but only abnormal results are displayed)  Labs Reviewed  CBC  BASIC METABOLIC PANEL  TROPONIN I   ____________________________________________  EKG  ED ECG REPORT I, Axavier Pressley, the attending physician, personally viewed and interpreted this ECG.  Date: 06/05/2016 EKG Time: 19:26 Rate: 65 Rhythm: normal sinus rhythm QRS Axis: normal Intervals: normal ST/T Wave abnormalities: Inverted T-wave in lead 3, otherwise unremarkable Conduction Disturbances: none Narrative Interpretation: unremarkable  ____________________________________________  RADIOLOGY   No results found.  ____________________________________________   PROCEDURES  Procedure(s) performed:   Procedures   Critical Care performed: No ____________________________________________   INITIAL IMPRESSION / ASSESSMENT AND PLAN / ED  COURSE  Pertinent labs & imaging results that were available during my care of the patient were reviewed by me and considered in my medical decision making (see chart for details).  I reviewed patient's past medical records including my own note from about a year and a half ago when I saw her for a similar complaint.  At the time the ENT recommendation was for follow-up as an outpatient and they felt that it was very unlikely that an acoustic neuroma would cause the headaches and dizziness that she was describing.  Additionally at this time she seems to have actually no symptoms with a completely intact neurological exam.  She even ate a full cheeseburger meal from [employer] while in the exam room.  I offered to treat her for her migraine with IV medications but she prefers not to at this time.  Instead I gave her Fioricet, the second tablet of which she declined because she stated that the first one tasted too chalky.  I encouraged her to follow up as an outpatient with her ENT doctor neurologist to determine if they feel additional outpatient imaging is necessary and she understands and agrees with the plan.  I provided her with a work note explaining that she was seen in the emergency department today.  A little while later her husband returned and ask if she could please have a work note for tomorrow as well.  I pointed out that she has no signs or symptoms or indication to keep her from working tomorrow and he explained that it was because she needed to call the ENT doctor for an appointment.  I stated that she should go ahead and call when the office opens and continue on to work unless they are able to provide an ENT appointment, at which point the ENT doctor can provide a work note.    I gave my usual and customary return precautions.        ____________________________________________  FINAL CLINICAL IMPRESSION(S) / ED DIAGNOSES  Final diagnoses:  Dizziness  Acute nonintractable  headache, unspecified headache type     MEDICATIONS GIVEN DURING THIS VISIT:  Medications  butalbital-acetaminophen-caffeine (FIORICET, ESGIC) 50-325-40 MG per tablet 2 tablet (not administered)     NEW OUTPATIENT MEDICATIONS STARTED DURING THIS VISIT:  New Prescriptions   No medications on file    Modified Medications   No medications on file    Discontinued Medications   No medications on file     Note:  This document was prepared using Dragon voice recognition software and may include unintentional dictation errors.    Hinda Kehr, MD 06/05/16 [phone]

## 2016-06-05 NOTE — ED Notes (Signed)
Pt c/o intermit dizziness and headaches x1wk, pt hx of belgian cyst x79yr ago and was told to be evaluated if dizziness began. Pt has not followed up with ENT in aprox 14mnths. Pt states some nausea with dizziness. Pt A&Ox4. Speech clear

## 2016-06-05 NOTE — Discharge Instructions (Signed)
As we discussed, your headache is likely unrelated to your acoustic neuroma.  We recommend he take over-the-counter medication for your headache and follow up with your ENT doctor in [place] at the next available opportunity to determine if additional outpatient testing as needed.  Return to the emergency department if you develop new or worsening symptoms that concern you.

## 2016-06-05 NOTE — ED Triage Notes (Addendum)
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

## 2016-07-16 ENCOUNTER — Emergency Department
Admission: EM | Admit: 2016-07-16 | Discharge: 2016-07-16 | Disposition: A | Discharge status: Home / Self Care | Payer: Medicaid Other | Attending: Emergency Medicine | Admitting: Emergency Medicine

## 2016-07-16 ENCOUNTER — Encounter: Payer: Self-pay | Admitting: Emergency Medicine

## 2016-07-16 ENCOUNTER — Emergency Department: Payer: Medicaid Other

## 2016-07-16 DIAGNOSIS — R079 Chest pain, unspecified: Secondary | ICD-10-CM | POA: Insufficient documentation

## 2016-07-16 DIAGNOSIS — R200 Anesthesia of skin: Secondary | ICD-10-CM | POA: Insufficient documentation

## 2016-07-16 DIAGNOSIS — Z5321 Procedure and treatment not carried out due to patient leaving prior to being seen by health care provider: Secondary | ICD-10-CM | POA: Insufficient documentation

## 2016-07-16 DIAGNOSIS — Z79899 Other long term (current) drug therapy: Secondary | ICD-10-CM | POA: Insufficient documentation

## 2016-07-16 LAB — CBC
HCT: 40.3 % (ref 35.0–47.0)
Hemoglobin: 13.3 g/dL (ref 12.0–16.0)
MCH: 29.9 pg (ref 26.0–34.0)
MCHC: 33.1 g/dL (ref 32.0–36.0)
MCV: 90.3 fL (ref 80.0–100.0)
PLATELETS: 219 10*3/uL (ref 150–440)
RBC: 4.46 MIL/uL (ref 3.80–5.20)
RDW: 13.3 % (ref 11.5–14.5)
WBC: 5.9 10*3/uL (ref 3.6–11.0)

## 2016-07-16 LAB — BASIC METABOLIC PANEL
Anion gap: 7 (ref 5–15)
BUN: 9 mg/dL (ref 6–20)
CALCIUM: 9 mg/dL (ref 8.9–10.3)
CO2: 23 mmol/L (ref 22–32)
CREATININE: 0.93 mg/dL (ref 0.44–1.00)
Chloride: 109 mmol/L (ref 101–111)
GFR calc Af Amer: >60 mL/min (ref >60)
Glucose, Bld: 82 mg/dL (ref 65–99)
Potassium: 3.8 mmol/L (ref 3.5–5.1)
SODIUM: 139 mmol/L (ref 135–145)

## 2016-07-16 LAB — TROPONIN I: Troponin I: <0.03 ng/mL (ref <0.03)

## 2016-07-16 NOTE — ED Triage Notes (Signed)
Pt presents with chest pain and left arm numbness started last night.

## 2016-07-16 NOTE — ED Notes (Signed)
Pt states she is leaving and going to see PCP, states feeling better, pt encouraged to stay to see MD but states she wants to leave

## 2016-07-26 ENCOUNTER — Encounter: Payer: Self-pay | Admitting: Emergency Medicine

## 2016-07-26 ENCOUNTER — Emergency Department
Admission: EM | Admit: 2016-07-26 | Discharge: 2016-07-26 | Disposition: A | Discharge status: Home / Self Care | Payer: Medicaid Other | Attending: Emergency Medicine | Admitting: Emergency Medicine

## 2016-07-26 DIAGNOSIS — J069 Acute upper respiratory infection, unspecified: Secondary | ICD-10-CM

## 2016-07-26 DIAGNOSIS — B9789 Other viral agents as the cause of diseases classified elsewhere: Secondary | ICD-10-CM

## 2016-07-26 DIAGNOSIS — Z79899 Other long term (current) drug therapy: Secondary | ICD-10-CM | POA: Insufficient documentation

## 2016-07-26 MED ORDER — BENZONATATE 100 MG PO CAPS: 200.0000 mg | ORAL_CAPSULE | Freq: Three times a day (TID) | ORAL | 0 refills | Status: AC | PRN

## 2016-07-26 NOTE — ED Provider Notes (Signed)
[facility] Emergency Department Provider Note  ____________________________________________   First MD Initiated Contact with Patient 07/26/16 [phone]     (approximate)  I have reviewed the triage vital signs and the nursing notes.   HISTORY  Chief Complaint Cough   HPI Kathryn Valencia is a 31 y.o. female is here with complaint of fever, chills, nonproductive cough and body aches for the last 3 days. Patient states that last week she had the gastroenteritis viral illness that was going around. She is now resolved from that. She states her mother was diagnosed with bronchitis yesterday and she thought she caught bronchitis.She has not actually taken her temperature at home but is felt feverish and had chills. Not taking any over-the-counter medication for her symptoms. She states she has been up since 4 AM coughing and has not taken any cough suppressant. She rates her pain as an 8/10.   Past Medical History:  Diagnosis Date  . Acoustic neuroma ([place]) 01/2015  . Chronic headaches   . Morbid obesity ([place])     There are no active problems to display for this patient.   History reviewed. No pertinent surgical history.  Prior to Admission medications   Medication Sig Start Date End Date Taking? Authorizing Provider  benzonatate (TESSALON PERLES) 100 MG capsule Take 2 capsules (200 mg total) by mouth 3 (three) times daily as needed. 07/26/16 07/26/17  Johnn Hai, PA-C  esomeprazole (NEXIUM) 40 MG capsule Take 40 mg by mouth daily at 12 noon.    Historical Provider, MD    Allergies Patient has no known allergies.  No family history on file.  Social History Social History  Substance Use Topics  . Smoking status: Never Smoker  . Smokeless tobacco: Never Used  . Alcohol use No    Review of Systems Constitutional: No fever/chills Eyes: No visual changes. ENT: No sore throat. Cardiovascular: Denies chest pain. Respiratory: Denies shortness of  breath.  Positive for cough. Gastrointestinal: No abdominal pain.  No nausea, no vomiting.  No diarrhea.   Genitourinary: Negative for dysuria. Musculoskeletal: Positive for generalized body aches. Skin: Negative for rash. Neurological: Negative for headaches, focal weakness or numbness.  10-point ROS otherwise negative.  ____________________________________________   PHYSICAL EXAM:  VITAL SIGNS: ED Triage Vitals  Enc Vitals Group     BP 07/26/16 0705 (!) 104/40     Pulse Rate 07/26/16 0705 67     Resp 07/26/16 0705 18     Temp 07/26/16 0705 97.7 F (36.5 C)     Temp Source 07/26/16 0705 Oral     SpO2 07/26/16 0705 95 %     Weight 07/26/16 0706 280 lb (127 kg)     Height 07/26/16 0706 5\' 7"  (1.702 m)     Head Circumference --      Peak Flow --      Pain Score 07/26/16 0706 8     Pain Loc --      Pain Edu? --      Excl. in [place]? --     Constitutional: Alert and oriented. Well appearing and in no acute distress.Obese. Eyes: Conjunctivae are normal. PERRL. EOMI. Head: Atraumatic. Nose: Mild congestion/rhinnorhea. Mouth/Throat: Mucous membranes are moist.  Oropharynx non-erythematous. Mild posterior drainage noted. Neck: No stridor.   Hematological/Lymphatic/Immunilogical: No cervical lymphadenopathy. Cardiovascular: Normal rate, regular rhythm. Grossly normal heart sounds.  Good peripheral circulation. Respiratory: Normal respiratory effort.  No retractions. Lungs CTAB. Occasional dry cough is heard.  Gastrointestinal: Soft and nontender.  No distention. Bowel sounds normal active. Musculoskeletal: His upper and lower extremities without any difficulty. Normal gait was noted. Neurologic:  Normal speech and language. No gross focal neurologic deficits are appreciated. No gait instability. Skin:  Skin is warm, dry and intact. No rash noted. Psychiatric: Mood and affect are normal. Speech and behavior are normal.  ____________________________________________   LABS (all  labs ordered are listed, but only abnormal results are displayed)  Labs Reviewed - No data to display   PROCEDURES  Procedure(s) performed: None  Procedures  Critical Care performed: No  ____________________________________________   INITIAL IMPRESSION / ASSESSMENT AND PLAN / ED COURSE  Pertinent labs & imaging results that were available during my care of the patient were reviewed by me and considered in my medical decision making (see chart for details).  Patient was given a prescription for Tessalon Perles 1 or 2 every 8 hours as needed for cough. She is encouraged to drink lots of fluids and take Tylenol or ibuprofen as needed for body aches. She is follow-up with her primary care doctor at [facility] is any continued problems.    ____________________________________________   FINAL CLINICAL IMPRESSION(S) / ED DIAGNOSES  Final diagnoses:  Viral URI with cough      NEW MEDICATIONS STARTED DURING THIS VISIT:  Discharge Medication List as of 07/26/2016  8:35 AM    START taking these medications   Details  benzonatate (TESSALON PERLES) 100 MG capsule Take 2 capsules (200 mg total) by mouth 3 (three) times daily as needed., Starting Sat 07/26/2016, Until Sun 07/26/2017, Print         Note:  This document was prepared using Dragon voice recognition software and may include unintentional dictation errors.    Johnn Hai, PA-C 07/26/16 [place], MD 07/26/16 1723

## 2016-07-26 NOTE — Discharge Instructions (Signed)
Increase fluids. Tylenol or ibuprofen as needed for headache, fever or body aches. Begin taking Tessalon Perles as needed for cough. Follow-up with your Dr. [facility] if any continued problems.

## 2016-07-26 NOTE — ED Triage Notes (Signed)
Cough fever and body aches x 3 days

## 2017-01-04 ENCOUNTER — Emergency Department
Admission: EM | Admit: 2017-01-04 | Discharge: 2017-01-04 | Disposition: A | Discharge status: Home / Self Care | Payer: Medicaid Other | Attending: Emergency Medicine | Admitting: Emergency Medicine

## 2017-01-04 ENCOUNTER — Encounter: Payer: Self-pay | Admitting: Emergency Medicine

## 2017-01-04 ENCOUNTER — Emergency Department: Payer: Medicaid Other

## 2017-01-04 DIAGNOSIS — Z79899 Other long term (current) drug therapy: Secondary | ICD-10-CM | POA: Insufficient documentation

## 2017-01-04 DIAGNOSIS — R109 Unspecified abdominal pain: Secondary | ICD-10-CM | POA: Diagnosis present

## 2017-01-04 DIAGNOSIS — R1011 Right upper quadrant pain: Secondary | ICD-10-CM | POA: Insufficient documentation

## 2017-01-04 DIAGNOSIS — N3000 Acute cystitis without hematuria: Secondary | ICD-10-CM | POA: Diagnosis not present

## 2017-01-04 LAB — CBC
HCT: 39.2 % (ref 35.0–47.0)
Hemoglobin: 13.3 g/dL (ref 12.0–16.0)
MCH: 30.7 pg (ref 26.0–34.0)
MCHC: 34 g/dL (ref 32.0–36.0)
MCV: 90.2 fL (ref 80.0–100.0)
PLATELETS: 197 10*3/uL (ref 150–440)
RBC: 4.34 MIL/uL (ref 3.80–5.20)
RDW: 13.5 % (ref 11.5–14.5)
WBC: 4.8 10*3/uL (ref 3.6–11.0)

## 2017-01-04 LAB — URINALYSIS, COMPLETE (UACMP) WITH MICROSCOPIC
BILIRUBIN URINE: NEGATIVE
Glucose, UA: NEGATIVE mg/dL
Hgb urine dipstick: NEGATIVE
Ketones, ur: NEGATIVE mg/dL
Nitrite: NEGATIVE
PH: 7 (ref 5.0–8.0)
Protein, ur: NEGATIVE mg/dL
SPECIFIC GRAVITY, URINE: 1.016 (ref 1.005–1.030)

## 2017-01-04 LAB — COMPREHENSIVE METABOLIC PANEL
ALK PHOS: 37 U/L — AB (ref 38–126)
ALT: 13 U/L — AB (ref 14–54)
AST: 19 U/L (ref 15–41)
Albumin: 3.6 g/dL (ref 3.5–5.0)
Anion gap: 6 (ref 5–15)
BILIRUBIN TOTAL: 0.5 mg/dL (ref 0.3–1.2)
BUN: 7 mg/dL (ref 6–20)
CALCIUM: 9 mg/dL (ref 8.9–10.3)
CO2: 26 mmol/L (ref 22–32)
CREATININE: 0.85 mg/dL (ref 0.44–1.00)
Chloride: 110 mmol/L (ref 101–111)
GFR calc non Af Amer: >60 mL/min (ref >60)
GLUCOSE: 102 mg/dL — AB (ref 65–99)
Potassium: 4.3 mmol/L (ref 3.5–5.1)
SODIUM: 142 mmol/L (ref 135–145)
Total Protein: 6.2 g/dL — ABNORMAL LOW (ref 6.5–8.1)

## 2017-01-04 LAB — LIPASE, BLOOD: Lipase: 29 U/L (ref 11–51)

## 2017-01-04 LAB — HCG, QUANTITATIVE, PREGNANCY: hCG, Beta Chain, Quant, S: 1 m[IU]/mL (ref <5)

## 2017-01-04 MED ORDER — ONDANSETRON HCL 4 MG/2ML IJ SOLN
4.0000 mg | Freq: Once | INTRAMUSCULAR | Status: AC
  Administered 2017-01-04: 4 mg via INTRAVENOUS
  Filled 2017-01-04: qty 2

## 2017-01-04 MED ORDER — KETOROLAC TROMETHAMINE 30 MG/ML IJ SOLN
15.0000 mg | Freq: Once | INTRAMUSCULAR | Status: AC
  Administered 2017-01-04: 15 mg via INTRAVENOUS
  Filled 2017-01-04: qty 1

## 2017-01-04 MED ORDER — CEPHALEXIN 500 MG PO CAPS
500.0000 mg | ORAL_CAPSULE | Freq: Once | ORAL | Status: AC
  Administered 2017-01-04: 500 mg via ORAL
  Filled 2017-01-04: qty 1

## 2017-01-04 MED ORDER — CEPHALEXIN 500 MG PO CAPS: 500.0000 mg | ORAL_CAPSULE | Freq: Four times a day (QID) | ORAL | 0 refills | Status: AC

## 2017-01-04 MED ORDER — NITROFURANTOIN MONOHYD MACRO 100 MG PO CAPS: 100.0000 mg | ORAL_CAPSULE | Freq: Two times a day (BID) | ORAL | 0 refills | Status: DC

## 2017-01-04 MED ORDER — SODIUM CHLORIDE 0.9 % IV BOLUS (SEPSIS)
1000.0000 mL | Freq: Once | INTRAVENOUS | Status: AC
  Administered 2017-01-04: 1000 mL via INTRAVENOUS

## 2017-01-04 MED ORDER — ONDANSETRON 4 MG PO TBDP: 4.0000 mg | ORAL_TABLET | Freq: Three times a day (TID) | ORAL | 0 refills | Status: DC | PRN

## 2017-01-04 MED ORDER — FAMOTIDINE 20 MG PO TABS: 20.0000 mg | ORAL_TABLET | Freq: Two times a day (BID) | ORAL | 0 refills | Status: DC

## 2017-01-04 MED ORDER — NITROFURANTOIN MONOHYD MACRO 100 MG PO CAPS: 100.0000 mg | ORAL_CAPSULE | Freq: Once | ORAL | Status: DC

## 2017-01-04 NOTE — ED Triage Notes (Signed)
Pt reports nausea and abdominal pain for past 2 weeks reports mid epigastric pain reports increases when she eats, reports lower back pain denies any urinary symptoms

## 2017-01-04 NOTE — ED Notes (Signed)
Pt ambulatory to restroom without difficulty.

## 2017-01-04 NOTE — ED Notes (Signed)
Patient transported to Ultrasound 

## 2017-01-04 NOTE — Discharge Instructions (Signed)

## 2017-01-04 NOTE — ED Provider Notes (Addendum)
[facility] Emergency Department Provider Note  ____________________________________________  Time seen: Approximately 11:11 AM  I have reviewed the triage vital signs and the nursing notes.   HISTORY  Chief Complaint Abdominal Pain   HPI Kathryn Valencia is a 31 y.o. female with a history of obesitywho presents for evaluation of abdominal pain and nausea. Patient reports 2 weeks of epigastric and right-sided intermittent abdominal pain that is worse postprandially associated with nausea and one episode of vomiting yesterday. No dysuria or hematuria, no flank pain, no fever or chills, no constipation or diarrhea, no vaginal discharge. Patient denies any prior abdominal surgeries. Pain is currently 7 out of 10. Patient does take daily NSAIDs for HA. No melena or hemataemsis  Past Medical History:  Diagnosis Date  . Acoustic neuroma ([place]) 01/2015  . Chronic headaches   . Morbid obesity ([place])     There are no active problems to display for this patient.   History reviewed. No pertinent surgical history.  Prior to Admission medications   Medication Sig Start Date End Date Taking? Authorizing Provider  benzonatate (TESSALON PERLES) 100 MG capsule Take 2 capsules (200 mg total) by mouth 3 (three) times daily as needed. 07/26/16 07/26/17  Johnn Hai, PA-C  cephALEXin (KEFLEX) 500 MG capsule Take 1 capsule (500 mg total) by mouth 4 (four) times daily. 01/04/17 01/11/17  Rudene Re, MD  esomeprazole (NEXIUM) 40 MG capsule Take 40 mg by mouth daily at 12 noon.    [provider]  famotidine (PEPCID) 20 MG tablet Take 1 tablet (20 mg total) by mouth 2 (two) times daily. 01/04/17 01/04/18  Rudene Re, MD  ondansetron (ZOFRAN ODT) 4 MG disintegrating tablet Take 1 tablet (4 mg total) by mouth every 8 (eight) hours as needed for nausea or vomiting. 01/04/17   Rudene Re, MD    Allergies Patient has no known allergies.  No family  history on file.  Social History Social History  Substance Use Topics  . Smoking status: Never Smoker  . Smokeless tobacco: Never Used  . Alcohol use No    Review of Systems  Constitutional: Negative for fever. Eyes: Negative for visual changes. ENT: Negative for sore throat. Neck: No neck pain  Cardiovascular: Negative for chest pain. Respiratory: Negative for shortness of breath. Gastrointestinal: + abdominal pain, nausea, and vomiting. No diarrhea. Genitourinary: Negative for dysuria. Musculoskeletal: Negative for back pain. Skin: Negative for rash. Neurological: Negative for headaches, weakness or numbness. Psych: No SI or HI  ____________________________________________   PHYSICAL EXAM:  VITAL SIGNS: ED Triage Vitals  Enc Vitals Group     BP 01/04/17 0946 (!) 104/51     Pulse Rate 01/04/17 0946 77     Resp 01/04/17 0946 20     Temp 01/04/17 0946 98.2 F (36.8 C)     Temp Source 01/04/17 0946 Oral     SpO2 01/04/17 0946 97 %     Weight 01/04/17 0947 280 lb (127 kg)     Height 01/04/17 0947 5\' 7"  (1.702 m)     Head Circumference --      Peak Flow --      Pain Score 01/04/17 0946 8     Pain Loc --      Pain Edu? --      Excl. in [place]? --     Constitutional: Alert and oriented. Well appearing and in no apparent distress. HEENT:      Head: Normocephalic and atraumatic.  Eyes: Conjunctivae are normal. Sclera is non-icteric.       Mouth/Throat: Mucous membranes are moist.       Neck: Supple with no signs of meningismus. Cardiovascular: Regular rate and rhythm. No murmurs, gallops, or rubs. 2+ symmetrical distal pulses are present in all extremities. No JVD. Respiratory: Normal respiratory effort. Lungs are clear to auscultation bilaterally. No wheezes, crackles, or rhonchi.  Gastrointestinal: Soft, ttp over the RUQ with negative Murphy sign, and non distended with positive bowel sounds. No rebound or guarding. Genitourinary: No CVA  tenderness. Musculoskeletal: Nontender with normal range of motion in all extremities. No edema, cyanosis, or erythema of extremities. Neurologic: Normal speech and language. Face is symmetric. Moving all extremities. No gross focal neurologic deficits are appreciated. Skin: Skin is warm, dry and intact. No rash noted. Psychiatric: Mood and affect are normal. Speech and behavior are normal.  ____________________________________________   LABS (all labs ordered are listed, but only abnormal results are displayed)  Labs Reviewed  COMPREHENSIVE METABOLIC PANEL - Abnormal; Notable for the following:       Result Value   Glucose, Bld 102 (*)    Total Protein 6.2 (*)    ALT 13 (*)    Alkaline Phosphatase 37 (*)    All other components within normal limits  URINALYSIS, COMPLETE (UACMP) WITH MICROSCOPIC - Abnormal; Notable for the following:    Color, Urine YELLOW (*)    APPearance CLEAR (*)    Leukocytes, UA TRACE (*)    Bacteria, UA RARE (*)    Squamous Epithelial / LPF 0-5 (*)    All other components within normal limits  URINE CULTURE  LIPASE, BLOOD  CBC  HCG, QUANTITATIVE, PREGNANCY   ____________________________________________  EKG  none  ____________________________________________  RADIOLOGY  RUQ [place]: negative  ____________________________________________   PROCEDURES  Procedure(s) performed: None Procedures Critical Care performed:  None ____________________________________________   INITIAL IMPRESSION / ASSESSMENT AND PLAN / ED COURSE   31 y.o. female with a history of obesitywho presents for evaluation of 2 weeks of intermittent and post-prandial abdominal pain and nausea. Patient is well-appearing, in no distress, has normal vital signs, she is tender to palpation on the right upper quadrant with negative Murphy sign, no right lower quadrant tenderness. Differential diagnosis including gallbladder disease versus gastritis versus peptic ulcer disease. Patient  has no urinary symptoms or flank pain therefore kidney stone and urinary tract infection are less likely. We'll get a right upper quadrant ultrasound, basic blood work, and treated patient's symptoms with IV fluids, Zofran, and Toradol.    _________________________ 11:45 AM on 01/04/2017 ----------------------------------------- Right upper quadrant ultrasound with no evidence of cholelithiasis or cholecystitis. Blood work for no acute findings. UA positive for urinary tract infection. No signs or symptoms of pyelonephritis with normal white count, no fever, no tachycardia. However will start patient on keflex for possibly early ascending infection. Recommended that patient avoid NSAIDs and take tylenol instead. We'll refer patient to GI to rule out gastritis or peptic ulcer disease. Will start patient on Pepcid.   Pertinent labs & imaging results that were available during my care of the patient were reviewed by me and considered in my medical decision making (see chart for details).    ____________________________________________   FINAL CLINICAL IMPRESSION(S) / ED DIAGNOSES  Final diagnoses:  RUQ abdominal pain  Acute cystitis without hematuria      NEW MEDICATIONS STARTED DURING THIS VISIT:  New Prescriptions   CEPHALEXIN (KEFLEX) 500 MG CAPSULE    Take  1 capsule (500 mg total) by mouth 4 (four) times daily.   FAMOTIDINE (PEPCID) 20 MG TABLET    Take 1 tablet (20 mg total) by mouth 2 (two) times daily.   ONDANSETRON (ZOFRAN ODT) 4 MG DISINTEGRATING TABLET    Take 1 tablet (4 mg total) by mouth every 8 (eight) hours as needed for nausea or vomiting.     Note:  This document was prepared using Dragon voice recognition software and may include unintentional dictation errors.    Alfred Levins, [place], MD 01/04/17 [place], [place], MD 01/04/17 [phone]

## 2017-01-05 LAB — URINE CULTURE

## 2017-01-16 ENCOUNTER — Other Ambulatory Visit: Payer: Self-pay | Admitting: Otolaryngology

## 2017-01-16 DIAGNOSIS — D333 Benign neoplasm of cranial nerves: Secondary | ICD-10-CM

## 2017-01-21 ENCOUNTER — Ambulatory Visit
Admission: RE | Admit: 2017-01-21 | Discharge: 2017-01-21 | Disposition: A | Discharge status: Home / Self Care | Payer: Medicaid Other | Source: Ambulatory Visit | Attending: Otolaryngology | Admitting: Otolaryngology

## 2017-01-21 DIAGNOSIS — D333 Benign neoplasm of cranial nerves: Secondary | ICD-10-CM | POA: Insufficient documentation

## 2017-01-21 MED ORDER — GADOBENATE DIMEGLUMINE 529 MG/ML IV SOLN
20.0000 mL | Freq: Once | INTRAVENOUS | Status: AC | PRN
  Administered 2017-01-21: 20 mL via INTRAVENOUS

## 2017-02-17 ENCOUNTER — Other Ambulatory Visit: Payer: Self-pay | Admitting: Nurse Practitioner

## 2017-02-17 DIAGNOSIS — R1084 Generalized abdominal pain: Secondary | ICD-10-CM

## 2017-02-17 DIAGNOSIS — R1115 Cyclical vomiting syndrome unrelated to migraine: Secondary | ICD-10-CM

## 2017-02-27 ENCOUNTER — Emergency Department: Payer: Medicaid Other

## 2017-02-27 ENCOUNTER — Emergency Department
Admission: EM | Admit: 2017-02-27 | Discharge: 2017-02-27 | Disposition: A | Discharge status: Home / Self Care | Payer: Medicaid Other | Attending: Emergency Medicine | Admitting: Emergency Medicine

## 2017-02-27 ENCOUNTER — Encounter: Payer: Self-pay | Admitting: Emergency Medicine

## 2017-02-27 DIAGNOSIS — R52 Pain, unspecified: Secondary | ICD-10-CM

## 2017-02-27 DIAGNOSIS — R61 Generalized hyperhidrosis: Secondary | ICD-10-CM | POA: Diagnosis not present

## 2017-02-27 DIAGNOSIS — R1010 Upper abdominal pain, unspecified: Secondary | ICD-10-CM | POA: Diagnosis not present

## 2017-02-27 DIAGNOSIS — R110 Nausea: Secondary | ICD-10-CM | POA: Diagnosis not present

## 2017-02-27 DIAGNOSIS — R55 Syncope and collapse: Secondary | ICD-10-CM

## 2017-02-27 DIAGNOSIS — Z79899 Other long term (current) drug therapy: Secondary | ICD-10-CM | POA: Insufficient documentation

## 2017-02-27 DIAGNOSIS — E162 Hypoglycemia, unspecified: Secondary | ICD-10-CM | POA: Diagnosis not present

## 2017-02-27 LAB — COMPREHENSIVE METABOLIC PANEL
ALK PHOS: 37 U/L — AB (ref 38–126)
ALT: 14 U/L (ref 14–54)
ANION GAP: 7 (ref 5–15)
AST: 21 U/L (ref 15–41)
Albumin: 3.5 g/dL (ref 3.5–5.0)
BILIRUBIN TOTAL: 0.7 mg/dL (ref 0.3–1.2)
BUN: 9 mg/dL (ref 6–20)
CALCIUM: 8.2 mg/dL — AB (ref 8.9–10.3)
CO2: 22 mmol/L (ref 22–32)
Chloride: 106 mmol/L (ref 101–111)
Creatinine, Ser: 0.69 mg/dL (ref 0.44–1.00)
GFR calc Af Amer: >60 mL/min (ref >60)
Glucose, Bld: 99 mg/dL (ref 65–99)
POTASSIUM: 3.3 mmol/L — AB (ref 3.5–5.1)
Sodium: 135 mmol/L (ref 135–145)
TOTAL PROTEIN: 6.1 g/dL — AB (ref 6.5–8.1)

## 2017-02-27 LAB — CBC
HEMATOCRIT: 40.4 % (ref 35.0–47.0)
HEMOGLOBIN: 13.8 g/dL (ref 12.0–16.0)
MCH: 31.4 pg (ref 26.0–34.0)
MCHC: 34.2 g/dL (ref 32.0–36.0)
MCV: 91.8 fL (ref 80.0–100.0)
PLATELETS: 206 10*3/uL (ref 150–440)
RBC: 4.4 MIL/uL (ref 3.80–5.20)
RDW: 13.3 % (ref 11.5–14.5)
WBC: 7.8 10*3/uL (ref 3.6–11.0)

## 2017-02-27 LAB — GLUCOSE, CAPILLARY: GLUCOSE-CAPILLARY: 90 mg/dL (ref 65–99)

## 2017-02-27 LAB — LIPASE, BLOOD: LIPASE: 25 U/L (ref 11–51)

## 2017-02-27 LAB — HCG, QUANTITATIVE, PREGNANCY: hCG, Beta Chain, Quant, S: 1 m[IU]/mL (ref <5)

## 2017-02-27 MED ORDER — ONDANSETRON HCL 4 MG/2ML IJ SOLN: 4.0000 mg | Freq: Once | INTRAMUSCULAR | Status: DC | PRN

## 2017-02-27 MED ORDER — SODIUM CHLORIDE 0.9 % IV BOLUS (SEPSIS)
1000.0000 mL | Freq: Once | INTRAVENOUS | Status: AC
  Administered 2017-02-27: 1000 mL via INTRAVENOUS

## 2017-02-27 MED ORDER — ONDANSETRON HCL 4 MG/2ML IJ SOLN
4.0000 mg | Freq: Once | INTRAMUSCULAR | Status: AC
  Administered 2017-02-27: 4 mg via INTRAVENOUS
  Filled 2017-02-27: qty 2

## 2017-02-27 MED ORDER — ONDANSETRON HCL 4 MG PO TABS: 4.0000 mg | ORAL_TABLET | Freq: Every day | ORAL | 1 refills | Status: AC | PRN

## 2017-02-27 MED ORDER — KETOROLAC TROMETHAMINE 30 MG/ML IJ SOLN
15.0000 mg | Freq: Once | INTRAMUSCULAR | Status: AC
  Administered 2017-02-27: 15 mg via INTRAVENOUS
  Filled 2017-02-27: qty 1

## 2017-02-27 NOTE — Discharge Instructions (Signed)
Please make sure you remain well-hydrated and follow-up with your primary care physician as needed. Keep your appointment to see her neurosurgeon for your upcoming brain surgery. Return to the emergency department for any concerns.  It was a pleasure to take care of you today, and thank you for coming to our emergency department.  If you have any questions or concerns before leaving please ask the nurse to grab me and I'm more than happy to go through your aftercare instructions again.  If you were prescribed any opioid pain medication today such as Norco, Vicodin, Percocet, morphine, hydrocodone, or oxycodone please make sure you do not drive when you are taking this medication as it can alter your ability to drive safely.  If you have any concerns once you are home that you are not improving or are in fact getting worse before you can make it to your follow-up appointment, please do not hesitate to call 911 and come back for further evaluation.  Darel Hong, MD  Results for orders placed or performed during the hospital encounter of 02/27/17  CBC  Result Value Ref Range   WBC 7.8 3.6 - 11.0 K/uL   RBC 4.40 3.80 - 5.20 MIL/uL   Hemoglobin 13.8 12.0 - 16.0 g/dL   HCT 40.4 35.0 - 47.0 %   MCV 91.8 80.0 - 100.0 fL   MCH 31.4 26.0 - 34.0 pg   MCHC 34.2 32.0 - 36.0 g/dL   RDW 13.3 11.5 - 14.5 %   Platelets 206 150 - 440 K/uL  Lipase, blood  Result Value Ref Range   Lipase 25 11 - 51 U/L  Comprehensive metabolic panel  Result Value Ref Range   Sodium 135 135 - 145 mmol/L   Potassium 3.3 (L) 3.5 - 5.1 mmol/L   Chloride 106 101 - 111 mmol/L   CO2 22 22 - 32 mmol/L   Glucose, Bld 99 65 - 99 mg/dL   BUN 9 6 - 20 mg/dL   Creatinine, Ser 0.69 0.44 - 1.00 mg/dL   Calcium 8.2 (L) 8.9 - 10.3 mg/dL   Total Protein 6.1 (L) 6.5 - 8.1 g/dL   Albumin 3.5 3.5 - 5.0 g/dL   AST 21 15 - 41 U/L   ALT 14 14 - 54 U/L   Alkaline Phosphatase 37 (L) 38 - 126 U/L   Total Bilirubin 0.7 0.3 - 1.2 mg/dL   GFR calc non Af Amer >60 >60 mL/min   GFR calc Af Amer >60 >60 mL/min   Anion gap 7 5 - 15  Glucose, capillary  Result Value Ref Range   Glucose-Capillary 90 65 - 99 mg/dL  hCG, quantitative, pregnancy  Result Value Ref Range   hCG, Beta Chain, Quant, S 1 <5 mIU/mL   Ct Head Wo Contrast  Result Date: 02/27/2017 CLINICAL DATA:  Dizziness, nausea and vomiting. Known left vestibular schwannoma. EXAM: CT HEAD WITHOUT CONTRAST TECHNIQUE: Contiguous axial images were obtained from the base of the skull through the vertex without intravenous contrast. COMPARISON:  Brain MR dated 01/21/2017. FINDINGS: Brain: Normal appearing cerebral hemispheres and posterior fossa structures. Normal size and position of the ventricles. The previously demonstrated mass within the left internal auditory canal is not visible by CT. No intracranial hemorrhage or CT evidence of acute infarction. Vascular: No hyperdense vessel or unexpected calcification. Skull: Normal. Negative for fracture or focal lesion. Sinuses/Orbits: Unremarkable. Other: None. IMPRESSION: No acute abnormality. Electronically Signed   By: Claudie Revering M.D.   On: 02/27/2017 18:42  US Abdomen Limited Ruq  Result Date: 02/27/2017 CLINICAL DATA:  Right upper quadrant pain 1 day. EXAM: ULTRASOUND ABDOMEN LIMITED RIGHT UPPER QUADRANT COMPARISON:  01/04/2017 FINDINGS: Gallbladder: No gallstones or wall thickening visualized. No sonographic Murphy sign noted by sonographer. Common bile duct: Diameter: 4.0 mm. Liver: No focal lesion identified. Within normal limits in parenchymal echogenicity. Portal vein is patent on color Doppler imaging with normal direction of blood flow towards the liver. IMPRESSION: Normal right upper quadrant ultrasound. Electronically Signed   By: Marin Olp M.D.   On: 02/27/2017 18:16

## 2017-02-27 NOTE — ED Provider Notes (Signed)
[facility] Emergency Department Provider Note  ____________________________________________   First MD Initiated Contact with Patient 02/27/17 1621     (approximate)  I have reviewed the triage vital signs and the nursing notes.   HISTORY  Chief Complaint Loss of Consciousness; Dizziness; and Abdominal Pain    HPI Kathryn Valencia is a 31 y.o. female who comes to the emergency department via EMS after sustaining a syncopal episode. This morning she began to feel nauseated lightheaded and have cramping moderate severity nonradiating upper abdominal pain. She vomited several times. Her husband was about to drive her to the hospital for evaluation when the patient vomited became diaphoretic and then passed out. When EMS arrived they noted that her blood sugar was 50 and they transported her to the hospital. She has a past medical history of acoustic neuroma, chronic headaches, and morbid obesity. She is currently not having any headaches. Her pain is upper abdominal not postprandial. Nothing seems to make it better or worse. She is not currently in any pain. She has no family history of sudden cardiac death.   Past Medical History:  Diagnosis Date  . Acoustic neuroma ([place]) 01/2015  . Chronic headaches   . Morbid obesity ([place])     There are no active problems to display for this patient.   History reviewed. No pertinent surgical history.  Prior to Admission medications   Medication Sig Start Date End Date Taking? Authorizing Provider  benzonatate (TESSALON PERLES) 100 MG capsule Take 2 capsules (200 mg total) by mouth 3 (three) times daily as needed. 07/26/16 07/26/17  Johnn Hai, PA-C  esomeprazole (NEXIUM) 40 MG capsule Take 40 mg by mouth daily at 12 noon.    [provider]  famotidine (PEPCID) 20 MG tablet Take 1 tablet (20 mg total) by mouth 2 (two) times daily. 01/04/17 01/04/18  Rudene Re, MD  ondansetron (ZOFRAN ODT) 4 MG  disintegrating tablet Take 1 tablet (4 mg total) by mouth every 8 (eight) hours as needed for nausea or vomiting. 01/04/17   Alfred Levins, [place], MD  ondansetron [facility]) 4 MG tablet Take 1 tablet (4 mg total) by mouth daily as needed for nausea or vomiting. 02/27/17 02/27/18  Darel Hong, MD    Allergies Patient has no known allergies.  No family history on file.  Social History Social History  Substance Use Topics  . Smoking status: Never Smoker  . Smokeless tobacco: Never Used  . Alcohol use No    Review of Systems Constitutional: No fever/chills Eyes: No visual changes. ENT: No sore throat. Cardiovascular: Denies chest pain. Respiratory: Denies shortness of breath. Gastrointestinal: Positive abdominal pain.  Positive nausea, positive vomiting.  No diarrhea.  No constipation. Genitourinary: Negative for dysuria. Musculoskeletal: Negative for back pain. Skin: Negative for rash. Neurological: Negative for headaches, focal weakness or numbness.   ____________________________________________   PHYSICAL EXAM:  VITAL SIGNS: ED Triage Vitals  Enc Vitals Group     BP 02/27/17 1514 117/67     Pulse Rate 02/27/17 1514 66     Resp 02/27/17 1514 16     Temp 02/27/17 1514 (!) 97.5 F (36.4 C)     Temp Source 02/27/17 1514 Oral     SpO2 02/27/17 1514 100 %     Weight 02/27/17 1515 292 lb (132.5 kg)     Height 02/27/17 1515 5\' 7"  (1.702 m)     Head Circumference --      Peak Flow --  Pain Score 02/27/17 1514 10     Pain Loc --      Pain Edu? --      Excl. in [place]? --     Constitutional: Alert and oriented 4 morbidly obese nontoxic no diaphoresis speaks in full clear sentences Eyes: PERRL EOMI. Head: Atraumatic. Nose: No congestion/rhinnorhea. Mouth/Throat: No trismus Neck: No stridor.   Cardiovascular: Normal rate, regular rhythm. Grossly normal heart sounds.  Good peripheral circulation. Respiratory: Normal respiratory effort.  No retractions. Lungs CTAB and  moving good air Gastrointestinal: Soft nondistended nontender no rebound or guarding no peritonitis negative Murphy's no McBurney's tenderness negative Rovsing's Musculoskeletal: No lower extremity edema   Neurologic:  Normal speech and language. No gross focal neurologic deficits are appreciated. Skin:  Skin is warm, dry and intact. No rash noted. Psychiatric: Mood and affect are normal. Speech and behavior are normal.    ____________________________________________   DIFFERENTIAL includes but not limited to  Vasovagal syncope, cardiogenic syncope, biliary colic, intracerebral mass ____________________________________________   LABS (all labs ordered are listed, but only abnormal results are displayed)  Labs Reviewed  COMPREHENSIVE METABOLIC PANEL - Abnormal; Notable for the following:       Result Value   Potassium 3.3 (*)    Calcium 8.2 (*)    Total Protein 6.1 (*)    Alkaline Phosphatase 37 (*)    All other components within normal limits  CBC  LIPASE, BLOOD  GLUCOSE, CAPILLARY  HCG, QUANTITATIVE, PREGNANCY    Blood work reviewed and interpreted by me shows slight hypokalemia and hypocalcemia of unclear clinical significance __________________________________________  EKG  ED ECG REPORT I, Darel Hong, the attending physician, personally viewed and interpreted this ECG.  Date: 02/27/2017 EKG Time:  Rate: 72 Rhythm: normal sinus rhythm QRS Axis: normal Intervals: normal ST/T Wave abnormalities: normal Narrative Interpretation: no evidence of acute ischemia. Normal intervals no arrhythmia no blocks and no signs of Brugada syndrome or HOCM  ____________________________________________  RADIOLOGY  Head CT reviewed by me shows no acute disease Whenever quadrant ultrasound reviewed by me shows no acute disease ____________________________________________   PROCEDURES  Procedure(s) performed: no  Procedures  Critical Care performed: no  Observation:  no ____________________________________________   INITIAL IMPRESSION / ASSESSMENT AND PLAN / ED COURSE  Pertinent labs & imaging results that were available during my care of the patient were reviewed by me and considered in my medical decision making (see chart for details).  The patient arrives well-appearing hemodynamically stable with normal vitals. Her episode of syncope today sounds very vasovagal with warmth lightheadedness and nausea and then passing out. Her blood sugar of 50 is difficult to explain as she is not taking any insulin or sulfonylurea medications. Her husband does report her missing breakfast this morning which could contribute to it. Abdominal pain nausea and vomiting is most likely an abdominal pathology, however she has a known acoustic neuroma and some concern she may have increasing intracerebral swelling so head CT in addition to right upper quadrant ultrasound are pending.  Fortunately the patient's imaging is reassuring. She feels markedly improved. She has been on the monitor while hours without issues. At this point I'm comfortable discharging her home with primary care follow-up. She verbalizes understanding and agreement with the plan.      ____________________________________________   FINAL CLINICAL IMPRESSION(S) / ED DIAGNOSES  Final diagnoses:  Pain  Hypoglycemia  Syncope, unspecified syncope type      NEW MEDICATIONS STARTED DURING THIS VISIT:  Discharge Medication List as  of 02/27/2017  7:20 PM    START taking these medications   Details  ondansetron (ZOFRAN) 4 MG tablet Take 1 tablet (4 mg total) by mouth daily as needed for nausea or vomiting., Starting Fri 02/27/2017, Until Sat 02/27/2018, Print         Note:  This document was prepared using Dragon voice recognition software and may include unintentional dictation errors.     Darel Hong, MD 02/28/17 1407

## 2017-02-27 NOTE — ED Triage Notes (Signed)
Patient presents to the ED with syncopal episode around 2pm and dizziness.  Patient is also complaining of nausea, vomiting and abdominal pain today but states she has had similar abdominal pain for the past year and occasional vomiting for the past 2 weeks.  Patient states she has an appointment to, "get checked for crohn's disease" soon.  Patient appears pale.  Patient presents by EMS and husband states when EMS arrived on the scene, patient's blood sugar was 50, patient drank some soda afterward.

## 2017-03-04 ENCOUNTER — Ambulatory Visit
Admission: RE | Admit: 2017-03-04 | Discharge: 2017-03-04 | Disposition: A | Discharge status: Home / Self Care | Payer: Medicaid Other | Source: Ambulatory Visit | Attending: Nurse Practitioner | Admitting: Nurse Practitioner

## 2017-03-04 DIAGNOSIS — R1115 Cyclical vomiting syndrome unrelated to migraine: Secondary | ICD-10-CM

## 2017-03-04 DIAGNOSIS — R1084 Generalized abdominal pain: Secondary | ICD-10-CM

## 2017-03-05 ENCOUNTER — Ambulatory Visit
Admission: RE | Admit: 2017-03-05 | Discharge: 2017-03-05 | Disposition: A | Discharge status: Home / Self Care | Payer: Medicaid Other | Source: Ambulatory Visit | Attending: Nurse Practitioner | Admitting: Nurse Practitioner

## 2017-03-05 DIAGNOSIS — R1084 Generalized abdominal pain: Secondary | ICD-10-CM | POA: Diagnosis present

## 2017-03-05 DIAGNOSIS — G43A0 Cyclical vomiting, not intractable: Secondary | ICD-10-CM | POA: Diagnosis not present

## 2017-03-05 MED ORDER — TECHNETIUM TC 99M MEBROFENIN IV KIT
5.3900 | PACK | Freq: Once | INTRAVENOUS | Status: AC | PRN
  Administered 2017-03-05: 5.39 via INTRAVENOUS

## 2017-03-12 ENCOUNTER — Ambulatory Visit: Payer: Self-pay | Admitting: General Surgery

## 2017-03-12 NOTE — H&P (Signed)
PATIENT PROFILE: Kathryn Valencia is a 31 y.o. female who presents to the Clinic for consultation at the request of Kathryn Valencia for cholecystectomy.  PCP:  Kathryn Hose Achirimofor, MD  HISTORY OF PRESENT ILLNESS: Kathryn Valencia reports that since many years ago she has been having abdominal pain. The pain is mostly localized to the right upper quadrant and radiates to the back. The patient refers associated nausea and vomiting. Refers it has been getting more frequently, almost every day. Pain may be associated to food but can also presents when she has not eaten. Nothing improves the pain. Denies episodes of fever or chills.    PROBLEM LIST:        Problem List  Date Reviewed: 02/17/2017         Noted   Morbid obesity (CMS-HCC) Unknown   Acoustic neuroma (CMS-HCC) Unknown   Depression, unspecified Unknown   Chronic headaches Unknown   Abdominal pain, generalized 02/17/2017   Non-intractable cyclical vomiting with nausea 02/17/2017      GENERAL REVIEW OF SYSTEMS:   General ROS: negative for - chills, fatigue, fever, weight gain or weight loss Allergy and Immunology ROS: negative for - hives  Hematological and Lymphatic ROS: negative for - bleeding problems or bruising, negative for palpable nodes Endocrine ROS: negative for - heat or cold intolerance, hair changes Respiratory ROS: negative for - cough, shortness of breath or wheezing Cardiovascular ROS: no chest pain or palpitations GI ROS: negative for nausea, vomiting, abdominal pain, diarrhea, constipation Musculoskeletal ROS: negative for - joint swelling or muscle pain Neurological ROS: negative for - confusion, syncope Dermatological ROS: negative for pruritus and rash  MEDICATIONS: CurrentMedications        Current Outpatient Prescriptions  Medication Sig Dispense Refill  . cetirizine (ZYRTEC) 5 MG tablet Take 5 mg by mouth once daily.    Marland Kitchen esomeprazole (NEXIUM) 40 MG DR capsule Take by mouth.    .  fluticasone propionate, micro (FLUTICASONE PROP, MICRO, BULK,) 100 % Powd FLONASE ALLERGY RELIEF 50 MCG/ACT SUSP    . norgestimate-ethinyl estradiol triphasic (ORTHO TRI-CYCLEN LO) 0.18/0.215/0.25 mg-25 mcg tablet ORTHO TRI-CYCLEN LO 0.18/0.215/0.25 MG-25 MCG TABS    . ondansetron (ZOFRAN-ODT) 8 MG disintegrating tablet Take 8 mg by mouth every 8 (eight) hours as needed for Nausea.    . sertraline (ZOLOFT) 100 MG tablet Take 100 mg by mouth once daily.     No current facility-administered medications for this visit.       ALLERGIES: Patient has no known allergies.  PAST MEDICAL HISTORY:     Past Medical History:  Diagnosis Date  . Abdominal pain, generalized 02/17/2017  . Acoustic neuroma (CMS-HCC)   . Chronic headaches   . Depression, unspecified   . Morbid obesity (CMS-HCC)   . Non-intractable cyclical vomiting with nausea 02/17/2017    PAST SURGICAL HISTORY: No past surgical history on file.   FAMILY HISTORY:      Family History  Problem Relation Age of Onset  . No Known Problems Mother   . Diabetes type II Father   . Crohn's disease Father   . Asthma Son   . Autism Son      SOCIAL HISTORY: Social History        Social History  . Marital status: Married    Spouse name: N/A  . Number of children: N/A  . Years of education: N/A   Social History Main Topics  . Smoking status: Never Smoker  . Smokeless tobacco: Never Used  . Alcohol  use No  . Drug use: No  . Sexual activity: Not Asked       Other Topics Concern  . None      Social History Narrative  . None    PHYSICAL EXAM:    Vitals:   03/12/17 1410  BP: 116/75  Pulse: 62  Temp: 36.7 C (98 F)   Body mass index is 45.73 kg/m. Weight: (!) 132.5 kg (292 lb)   General Appearance:    Alert, cooperative, no distress, appears stated age  Head:     Atraumatic, normocephalic  Eyes:   Anciteric, no erythema, no secretions  Neck:   Supple, symmetrical, no JVD, no  palpable lymph nodes  Mouth:   Lips, mucosa, and tongue normal;   Lungs:     Clear to auscultation bilaterally, respirations unlabored   Heart:    Regular rate and rhythm, S1 and S2 normal, no murmur, rub   or gallop  Abdomen:     Soft, non-tender, bowel sounds active all four quadrants,    no masses, no organomegaly. Globose  Extremities:   Extremities normal, atraumatic, no cyanosis or edema  Skin:   Skin color, texture, turgor normal, no rashes or lesions   Neurologic:   Grossly intact.    REVIEW OF DATA: I have reviewed the following data today:      Initial consult on 02/17/2017  Component Date Value  . Helicobacter Pylori Anti* 02/17/2017 Negative   . HCG Quant, Serum Pregnan* 02/17/2017 <0.6      ASSESSMENT: Kathryn Valencia is a 31 y.o. female presenting for consultation for evaluation for cholecystectomy. Patient has been evaluated by GI service multiple times due to this abdominal pain. They even ruled out Crohn's disease since patient has family history of Crohn's. Abdominal ultrasound show no gallstone but HIDA scan show an Ejection Fraction of 8%. Since symptoms are consistent with biliary colic, cholecystectomy was offered to the patient. Patient was oriented that 85% of patient respond to cholecystectomy but a significant amount of patient continue with pain after cholecystectomy. If that the case, repeated endoscopy or even ERCP with sphincterotomy may be considered. Will proceed with Laparoscopic Cholecystectomy.   PLAN: 1. Laparoscopic Cholecystectomy (CPT: [id number]) 2. Avoid fatty food 3. Pre operative workup: CBC, CMP  Patient her mother verbalized understanding, all questions were answered, and were agreeable with the plan outlined above.   Herbert Pun, MD  Electronically signed by Herbert Pun, MD  Patient information Handout Cholecystectomy (Surgical Removal of the Gallbladder)  The gallbladder is a small organ under the liver that stores  the bile produced by the liver  Gallstones are hardened digestive fluid that can for in the gallbladder  The procedure is done to remove the gallbladder due to gallstone causing pain or infection  Complications:  Retained common bile duct stone 4%  Bile duct injury or leak 0.5%  Wound infection 1%  Bleeding <1%  Pneumonia 4.0%  Heart complications 9.7%  Blood clots (DVT): 0.2%  Death 0.1%  Alternatives  Watchful waiting is recommended if patient has no symptoms   Low fat diet and exercise

## 2017-03-20 ENCOUNTER — Encounter: Payer: Self-pay | Admitting: Emergency Medicine

## 2017-03-20 DIAGNOSIS — R197 Diarrhea, unspecified: Secondary | ICD-10-CM | POA: Insufficient documentation

## 2017-03-20 DIAGNOSIS — R110 Nausea: Secondary | ICD-10-CM | POA: Insufficient documentation

## 2017-03-20 DIAGNOSIS — Z5321 Procedure and treatment not carried out due to patient leaving prior to being seen by health care provider: Secondary | ICD-10-CM | POA: Diagnosis not present

## 2017-03-20 DIAGNOSIS — R1032 Left lower quadrant pain: Secondary | ICD-10-CM | POA: Diagnosis not present

## 2017-03-20 DIAGNOSIS — N390 Urinary tract infection, site not specified: Secondary | ICD-10-CM

## 2017-03-20 HISTORY — DX: Urinary tract infection, site not specified: N39.0

## 2017-03-20 LAB — COMPREHENSIVE METABOLIC PANEL
ALT: 14 U/L (ref 14–54)
ANION GAP: 7 (ref 5–15)
AST: 23 U/L (ref 15–41)
Albumin: 3.1 g/dL — ABNORMAL LOW (ref 3.5–5.0)
Alkaline Phosphatase: 42 U/L (ref 38–126)
BILIRUBIN TOTAL: 0.4 mg/dL (ref 0.3–1.2)
BUN: 8 mg/dL (ref 6–20)
CALCIUM: 8.5 mg/dL — AB (ref 8.9–10.3)
CO2: 22 mmol/L (ref 22–32)
Chloride: 111 mmol/L (ref 101–111)
Creatinine, Ser: 0.71 mg/dL (ref 0.44–1.00)
GFR calc Af Amer: >60 mL/min (ref >60)
Glucose, Bld: 140 mg/dL — ABNORMAL HIGH (ref 65–99)
POTASSIUM: 3.7 mmol/L (ref 3.5–5.1)
Sodium: 140 mmol/L (ref 135–145)
TOTAL PROTEIN: 6.1 g/dL — AB (ref 6.5–8.1)

## 2017-03-20 LAB — LIPASE, BLOOD: Lipase: 32 U/L (ref 11–51)

## 2017-03-20 LAB — URINALYSIS, COMPLETE (UACMP) WITH MICROSCOPIC
Bilirubin Urine: NEGATIVE
Glucose, UA: NEGATIVE mg/dL
KETONES UR: NEGATIVE mg/dL
Nitrite: NEGATIVE
PH: 5 (ref 5.0–8.0)
PROTEIN: NEGATIVE mg/dL
Specific Gravity, Urine: 1.025 (ref 1.005–1.030)

## 2017-03-20 LAB — CBC
HEMATOCRIT: 38.8 % (ref 35.0–47.0)
Hemoglobin: 13.2 g/dL (ref 12.0–16.0)
MCH: 31.2 pg (ref 26.0–34.0)
MCHC: 34 g/dL (ref 32.0–36.0)
MCV: 91.9 fL (ref 80.0–100.0)
Platelets: 226 10*3/uL (ref 150–440)
RBC: 4.22 MIL/uL (ref 3.80–5.20)
RDW: 13.2 % (ref 11.5–14.5)
WBC: 6.2 10*3/uL (ref 3.6–11.0)

## 2017-03-20 LAB — GLUCOSE, CAPILLARY: GLUCOSE-CAPILLARY: 127 mg/dL — AB (ref 65–99)

## 2017-03-20 MED ORDER — ONDANSETRON 4 MG PO TBDP
4.0000 mg | ORAL_TABLET | Freq: Once | ORAL | Status: AC | PRN
  Administered 2017-03-20: 4 mg via ORAL
  Filled 2017-03-20: qty 1

## 2017-03-20 NOTE — ED Triage Notes (Signed)
Patient arrived from home co LLQ pain, is scheduled for gall bladder removal next week.  SO states she ate a bunch of cheese today and she has started having this pain.  Pt reports feeling nauseous and had diarrhea a couple of times today.

## 2017-03-20 NOTE — ED Notes (Signed)
Patient Kathryn Valencia stated she has had trouble before with her glucose and falls.  Adding order for CBG

## 2017-03-21 ENCOUNTER — Emergency Department
Admission: EM | Admit: 2017-03-21 | Discharge: 2017-03-21 | Disposition: A | Discharge status: Home / Self Care | Payer: Medicaid Other | Attending: Emergency Medicine | Admitting: Emergency Medicine

## 2017-03-21 NOTE — ED Notes (Signed)
Pt left ED at [place]. Per MD Owens Shark, pt needs antibiotic for UTI. Pt called at home by this RN and informed that she needs to be treated for this. Pt told this RN over phone that her doctors office was open for a half day in the AM and that she would call them for an appointment.

## 2017-03-23 ENCOUNTER — Encounter
Admission: RE | Admit: 2017-03-23 | Discharge: 2017-03-23 | Disposition: A | Discharge status: Home / Self Care | Payer: Medicaid Other | Source: Ambulatory Visit | Attending: General Surgery | Admitting: General Surgery

## 2017-03-23 HISTORY — DX: Gastro-esophageal reflux disease without esophagitis: K21.9

## 2017-03-23 HISTORY — DX: Depression, unspecified: F32.A

## 2017-03-23 HISTORY — DX: Urinary tract infection, site not specified: N39.0

## 2017-03-23 HISTORY — DX: Major depressive disorder, single episode, unspecified: F32.9

## 2017-03-23 NOTE — Patient Instructions (Signed)
Your procedure is scheduled on: 03-25-17 Report to Same Day Surgery 2nd floor medical mall [facility] Entrance-take elevator on left to 2nd floor.  Check in with surgery information desk.) To find out your arrival time please call [phone] between 1PM - 3PM on 03-24-17  Remember: Instructions that are not followed completely may result in serious medical risk, up to and including death, or upon the discretion of your surgeon and anesthesiologist your surgery may need to be rescheduled.    _x___ 1. Do not eat food after midnight the night before your procedure. You may drink clear liquids up to 2 hours before you are scheduled to arrive at the hospital for your procedure.  Do not drink clear liquids within 2 hours of your scheduled arrival to the hospital.  Clear liquids include  --Water or Apple juice without pulp  --Clear carbohydrate beverage such as ClearFast or Gatorade  --Black Coffee or Clear Tea (No milk, no creamers, do not add anything to  the coffee or Tea Type 1 and type 2 diabetics should only drink water.  No gum chewing or hard candies.     __x__ 2. No Alcohol for 24 hours before or after surgery.   __x__3. No Smoking for 24 prior to surgery.   ____  4. Bring all medications with you on the day of surgery if instructed.    __x__ 5. Notify your doctor if there is any change in your medical condition     (cold, fever, infections).     Do not wear jewelry, make-up, hairpins, clips or nail polish.  Do not wear lotions, powders, or perfumes. You may wear deodorant.  Do not shave 48 hours prior to surgery. Men may shave face and neck.  Do not bring valuables to the hospital.    [facility] is not responsible for any belongings or valuables.               Contacts, dentures or bridgework may not be worn into surgery.  Leave your suitcase in the car. After surgery it may be brought to your room.  For patients admitted to the hospital, discharge time is determined by  your treatment team.   Patients discharged the day of surgery will not be allowed to drive home.  You will need someone to drive you home and stay with you the night of your procedure.    Please read over the following fact sheets that you were given:     _x___ [place] WITH A SMALL SIP OF WATER. These include:  1. ZOLOFT  2. [place]  3. TAKE AN EXTRA NEXIUM ON Tuesday NIGHT BEFORE BED  4.  5.  6.  ____Fleets enema or Magnesium Citrate as directed.   ____ Use CHG Soap or sage wipes as directed on instruction sheet   ____ Use inhalers on the day of surgery and bring to hospital day of surgery  ____ Stop Metformin and Janumet 2 days prior to surgery.    ____ Take 1/2 of usual insulin dose the night before surgery and none on the morning     surgery.   ____ Follow recommendations from Cardiologist, Pulmonologist or PCP regarding stopping Aspirin, Coumadin, Plavix ,Eliquis, Effient, or Pradaxa, and Pletal.  X____Stop Anti-inflammatories such as Advil, Aleve, Ibuprofen, Motrin, Naproxen, Naprosyn, Goodies powders or aspirin products NOW-OK to take Tylenol   ____ Stop supplements until after surgery.     ____ Bring C-Pap to the hospital.

## 2017-03-23 NOTE — Pre-Procedure Instructions (Signed)
EKG  ED ECG REPORT I, Darel Hong, the attending physician, personally viewed and interpreted this ECG.  Date: 02/27/2017 EKG Time:  Rate: 72 Rhythm: normal sinus rhythm QRS Axis: normal Intervals: normal ST/T Wave abnormalities: normal Narrative Interpretation: no evidence of acute ischemia. Normal intervals no arrhythmia no blocks and no signs of Brugada syndrome or HOCM  ____________________________________________  RADIOLOGY  Head CT reviewed by me shows no acute disease Whenever quadrant ultrasound reviewed by me shows no acute disease ____________________________________________   PROCEDURES  Procedure(s) performed: no  Procedures  Critical Care performed: no  Observation: no ____________________________________________   INITIAL IMPRESSION / ASSESSMENT AND PLAN / ED COURSE  Pertinent labs & imaging results that were available during my care of the patient were reviewed by me and considered in my medical decision making (see chart for details).  The patient arrives well-appearing hemodynamically stable with normal vitals. Her episode of syncope today sounds very vasovagal with warmth lightheadedness and nausea and then passing out. Her blood sugar of 50 is difficult to explain as she is not taking any insulin or sulfonylurea medications. Her husband does report her missing breakfast this morning which could contribute to it. Abdominal pain nausea and vomiting is most likely an abdominal pathology, however she has a known acoustic neuroma and some concern she may have increasing intracerebral swelling so head CT in addition to right upper quadrant ultrasound are pending.  Fortunately the patient's imaging is reassuring. She feels markedly improved. She has been on the monitor while hours without issues. At this point I'm comfortable discharging her home with primary care follow-up. She verbalizes understanding and agreement with the  plan.    ____________________________________________   FINAL CLINICAL IMPRESSION(S) / ED DIAGNOSES  Final diagnoses:  Pain  Hypoglycemia  Syncope, unspecified syncope type      NEW MEDICATIONS STARTED DURING THIS VISIT:     Discharge Medication List as of 02/27/2017  7:20 PM       START taking these medications   Details  ondansetron (ZOFRAN) 4 MG tablet Take 1 tablet (4 mg total) by mouth daily as needed for nausea or vomiting., Starting Fri 02/27/2017, Until Sat 02/27/2018, Print         Note:  This document was prepared using Dragon voice recognition software and may include unintentional dictation errors.     Darel Hong, MD 02/28/17 1407     Electronically signed by Darel Hong, MD at 02/28/2017 2:07 PM      ED on 02/27/2017        Detailed Report

## 2017-03-24 MED ORDER — DEXTROSE 5 % IV SOLN
3.0000 g | INTRAVENOUS | Status: AC
  Administered 2017-03-25: 3 g via INTRAVENOUS
  Filled 2017-03-24: qty 3000

## 2017-03-25 ENCOUNTER — Ambulatory Visit
Admission: RE | Admit: 2017-03-25 | Discharge: 2017-03-25 | Disposition: A | Discharge status: Home / Self Care | Payer: Medicaid Other | Source: Ambulatory Visit | Attending: General Surgery | Admitting: General Surgery

## 2017-03-25 ENCOUNTER — Ambulatory Visit: Payer: Medicaid Other | Admitting: Anesthesiology

## 2017-03-25 ENCOUNTER — Encounter
Admission: RE | Disposition: A | Discharge status: Home / Self Care | Payer: Self-pay | Source: Ambulatory Visit | Attending: General Surgery

## 2017-03-25 DIAGNOSIS — F329 Major depressive disorder, single episode, unspecified: Secondary | ICD-10-CM | POA: Insufficient documentation

## 2017-03-25 DIAGNOSIS — K219 Gastro-esophageal reflux disease without esophagitis: Secondary | ICD-10-CM | POA: Diagnosis not present

## 2017-03-25 DIAGNOSIS — Z6841 Body Mass Index (BMI) 40.0 and over, adult: Secondary | ICD-10-CM | POA: Insufficient documentation

## 2017-03-25 DIAGNOSIS — K828 Other specified diseases of gallbladder: Secondary | ICD-10-CM | POA: Insufficient documentation

## 2017-03-25 HISTORY — PX: CHOLECYSTECTOMY: SHX55

## 2017-03-25 LAB — POCT PREGNANCY, URINE: Preg Test, Ur: NEGATIVE

## 2017-03-25 SURGERY — LAPAROSCOPIC CHOLECYSTECTOMY
Anesthesia: General | Site: Abdomen | Wound class: Clean Contaminated

## 2017-03-25 MED ORDER — ROCURONIUM BROMIDE 50 MG/5ML IV SOLN
INTRAVENOUS | Status: AC
  Filled 2017-03-25: qty 2

## 2017-03-25 MED ORDER — PROPOFOL 10 MG/ML IV BOLUS
INTRAVENOUS | Status: AC
  Filled 2017-03-25: qty 20

## 2017-03-25 MED ORDER — BUPIVACAINE-EPINEPHRINE (PF) 0.25% -1:200000 IJ SOLN
INTRAMUSCULAR | Status: DC | PRN
  Administered 2017-03-25: 9 mL via PERINEURAL

## 2017-03-25 MED ORDER — FENTANYL CITRATE (PF) 100 MCG/2ML IJ SOLN
INTRAMUSCULAR | Status: AC
  Administered 2017-03-25: 25 ug via INTRAVENOUS
  Filled 2017-03-25: qty 2

## 2017-03-25 MED ORDER — HYDROCODONE-ACETAMINOPHEN 7.5-325 MG PO TABS: 1.0000 | ORAL_TABLET | ORAL | Status: DC | PRN

## 2017-03-25 MED ORDER — KETOROLAC TROMETHAMINE 30 MG/ML IJ SOLN
INTRAMUSCULAR | Status: DC | PRN
  Administered 2017-03-25: 30 mg via INTRAVENOUS

## 2017-03-25 MED ORDER — FENTANYL CITRATE (PF) 100 MCG/2ML IJ SOLN
25.0000 ug | INTRAMUSCULAR | Status: DC | PRN
  Administered 2017-03-25 (×3): 25 ug via INTRAVENOUS

## 2017-03-25 MED ORDER — ACETAMINOPHEN 10 MG/ML IV SOLN
INTRAVENOUS | Status: AC
  Filled 2017-03-25: qty 100

## 2017-03-25 MED ORDER — ONDANSETRON HCL 4 MG/2ML IJ SOLN
4.0000 mg | Freq: Once | INTRAMUSCULAR | Status: AC | PRN
  Administered 2017-03-25: 4 mg via INTRAVENOUS

## 2017-03-25 MED ORDER — ONDANSETRON HCL 4 MG/2ML IJ SOLN
INTRAMUSCULAR | Status: DC | PRN
  Administered 2017-03-25: 4 mg via INTRAVENOUS

## 2017-03-25 MED ORDER — LACTATED RINGERS IV SOLN
INTRAVENOUS | Status: DC
  Administered 2017-03-25: 08:00:00 via INTRAVENOUS

## 2017-03-25 MED ORDER — MIDAZOLAM HCL 2 MG/2ML IJ SOLN
INTRAMUSCULAR | Status: AC
  Filled 2017-03-25: qty 2

## 2017-03-25 MED ORDER — ENOXAPARIN SODIUM 40 MG/0.4ML ~~LOC~~ SOLN
40.0000 mg | Freq: Once | SUBCUTANEOUS | Status: AC
  Administered 2017-03-25: 40 mg via SUBCUTANEOUS
  Filled 2017-03-25: qty 0.4

## 2017-03-25 MED ORDER — KETOROLAC TROMETHAMINE 30 MG/ML IJ SOLN
INTRAMUSCULAR | Status: AC
  Filled 2017-03-25: qty 1

## 2017-03-25 MED ORDER — FENTANYL CITRATE (PF) 100 MCG/2ML IJ SOLN
INTRAMUSCULAR | Status: DC | PRN
  Administered 2017-03-25: 100 ug via INTRAVENOUS
  Administered 2017-03-25 (×2): 50 ug via INTRAVENOUS

## 2017-03-25 MED ORDER — PHENYLEPHRINE HCL 10 MG/ML IJ SOLN
INTRAMUSCULAR | Status: AC
  Filled 2017-03-25: qty 1

## 2017-03-25 MED ORDER — DEXAMETHASONE SODIUM PHOSPHATE 10 MG/ML IJ SOLN
INTRAMUSCULAR | Status: AC
  Filled 2017-03-25: qty 1

## 2017-03-25 MED ORDER — TRAMADOL HCL 50 MG PO TABS
ORAL_TABLET | ORAL | Status: AC
  Filled 2017-03-25: qty 1

## 2017-03-25 MED ORDER — DEXAMETHASONE SODIUM PHOSPHATE 10 MG/ML IJ SOLN
INTRAMUSCULAR | Status: DC | PRN
  Administered 2017-03-25: 10 mg via INTRAVENOUS

## 2017-03-25 MED ORDER — SUGAMMADEX SODIUM 200 MG/2ML IV SOLN
INTRAVENOUS | Status: AC
  Filled 2017-03-25: qty 2

## 2017-03-25 MED ORDER — ONDANSETRON HCL 4 MG/2ML IJ SOLN
INTRAMUSCULAR | Status: AC
  Administered 2017-03-25: 4 mg via INTRAVENOUS
  Filled 2017-03-25: qty 2

## 2017-03-25 MED ORDER — TRAMADOL HCL 50 MG PO TABS
50.0000 mg | ORAL_TABLET | Freq: Four times a day (QID) | ORAL | Status: DC | PRN
  Administered 2017-03-25: 50 mg via ORAL

## 2017-03-25 MED ORDER — LIDOCAINE HCL (PF) 2 % IJ SOLN
INTRAMUSCULAR | Status: AC
  Filled 2017-03-25: qty 10

## 2017-03-25 MED ORDER — FENTANYL CITRATE (PF) 100 MCG/2ML IJ SOLN
INTRAMUSCULAR | Status: AC
  Filled 2017-03-25: qty 2

## 2017-03-25 MED ORDER — EPHEDRINE SULFATE 50 MG/ML IJ SOLN
INTRAMUSCULAR | Status: AC
  Filled 2017-03-25: qty 1

## 2017-03-25 MED ORDER — MIDAZOLAM HCL 2 MG/2ML IJ SOLN
INTRAMUSCULAR | Status: DC | PRN
  Administered 2017-03-25: 2 mg via INTRAVENOUS

## 2017-03-25 MED ORDER — BUPIVACAINE-EPINEPHRINE (PF) 0.5% -1:200000 IJ SOLN
INTRAMUSCULAR | Status: AC
Start: 2017-03-25 — End: ?
  Filled 2017-03-25: qty 30

## 2017-03-25 MED ORDER — LIDOCAINE HCL (CARDIAC) 20 MG/ML IV SOLN
INTRAVENOUS | Status: DC | PRN
  Administered 2017-03-25: 100 mg via INTRAVENOUS

## 2017-03-25 MED ORDER — SUGAMMADEX SODIUM 200 MG/2ML IV SOLN
INTRAVENOUS | Status: DC | PRN
  Administered 2017-03-25: 200 mg via INTRAVENOUS

## 2017-03-25 MED ORDER — PROPOFOL 10 MG/ML IV BOLUS
INTRAVENOUS | Status: DC | PRN
  Administered 2017-03-25: 200 mg via INTRAVENOUS

## 2017-03-25 MED ORDER — ACETAMINOPHEN 10 MG/ML IV SOLN
INTRAVENOUS | Status: DC | PRN
  Administered 2017-03-25: 1000 mg via INTRAVENOUS

## 2017-03-25 MED ORDER — ROCURONIUM BROMIDE 100 MG/10ML IV SOLN
INTRAVENOUS | Status: DC | PRN
  Administered 2017-03-25 (×5): 10 mg via INTRAVENOUS
  Administered 2017-03-25: 50 mg via INTRAVENOUS

## 2017-03-25 MED ORDER — ONDANSETRON HCL 4 MG/2ML IJ SOLN
INTRAMUSCULAR | Status: AC
  Filled 2017-03-25: qty 2

## 2017-03-25 MED ORDER — TRAMADOL HCL 50 MG PO TABS: 50.0000 mg | ORAL_TABLET | Freq: Four times a day (QID) | ORAL | 0 refills | Status: AC | PRN

## 2017-03-25 SURGICAL SUPPLY — 41 items
APPLIER CLIP LOGIC TI 5 (MISCELLANEOUS) ×4 IMPLANT
APPLIER CLIP ROT 10 11.4 M/L (STAPLE) ×2
CANISTER SUCT 1200ML W/VALVE (MISCELLANEOUS) ×2 IMPLANT
CANNULA DILATOR 10 W/SLV (CANNULA) ×2 IMPLANT
CHLORAPREP W/TINT 26ML (MISCELLANEOUS) ×2 IMPLANT
CLIP APPLIE ROT 10 11.4 M/L (STAPLE) ×1 IMPLANT
CNTNR SPEC 2.5X3XGRAD LEK (MISCELLANEOUS) ×1
CONT SPEC 4OZ STER OR WHT (MISCELLANEOUS) ×1
CONTAINER SPEC 2.5X3XGRAD LEK (MISCELLANEOUS) ×1 IMPLANT
DRAPE SHEET LG 3/4 BI-LAMINATE (DRAPES) ×10 IMPLANT
ELECT REM PT RETURN 9FT ADLT (ELECTROSURGICAL) ×2
ELECTRODE REM PT RTRN 9FT ADLT (ELECTROSURGICAL) ×1 IMPLANT
GAUZE SPONGE 4X4 12PLY STRL (GAUZE/BANDAGES/DRESSINGS) IMPLANT
GLOVE BIO SURGEON STRL SZ 6.5 (GLOVE) ×2 IMPLANT
GOWN STRL REUS W/ TWL LRG LVL3 (GOWN DISPOSABLE) ×3 IMPLANT
GOWN STRL REUS W/TWL LRG LVL3 (GOWN DISPOSABLE) ×3
GRASPER SUT TROCAR 14GX15 (MISCELLANEOUS) ×2 IMPLANT
HEMOSTAT SURGICEL 2X3 (HEMOSTASIS) ×2 IMPLANT
IRRIGATION STRYKERFLOW (MISCELLANEOUS) ×1 IMPLANT
IRRIGATOR STRYKERFLOW (MISCELLANEOUS) ×2
IV NS 1000ML (IV SOLUTION) ×1
IV NS 1000ML BAXH (IV SOLUTION) ×1 IMPLANT
KIT RM TURNOVER STRD PROC AR (KITS) ×2 IMPLANT
L-HOOK LAP DISP 36CM (ELECTROSURGICAL) ×2
LABEL OR SOLS (LABEL) ×2 IMPLANT
LHOOK LAP DISP 36CM (ELECTROSURGICAL) ×1 IMPLANT
NDL INSUFF ACCESS 14 VERSASTEP (NEEDLE) IMPLANT
NEEDLE FILTER BLUNT 18X 1/2SAF (NEEDLE) ×1
NEEDLE FILTER BLUNT 18X1 1/2 (NEEDLE) ×1 IMPLANT
NS IRRIG 500ML POUR BTL (IV SOLUTION) ×2 IMPLANT
PACK LAP CHOLECYSTECTOMY (MISCELLANEOUS) ×2 IMPLANT
SCISSORS METZENBAUM CVD 33 (INSTRUMENTS) ×2 IMPLANT
SLEEVE ENDOPATH XCEL 5M (ENDOMECHANICALS) ×6 IMPLANT
STRIP CLOSURE SKIN 1/2X4 (GAUZE/BANDAGES/DRESSINGS) ×2 IMPLANT
SUT MNCRL AB 4-0 PS2 18 (SUTURE) ×2 IMPLANT
SUT VICRYL 0 UR6 27IN ABS (SUTURE) ×2 IMPLANT
SYR 3ML LL SCALE MARK (SYRINGE) ×2 IMPLANT
TROCAR XCEL NON-BLD 11X100MML (ENDOMECHANICALS) ×2 IMPLANT
TROCAR XCEL NON-BLD 5MMX100MML (ENDOMECHANICALS) ×2 IMPLANT
TUBING INSUFFLATOR HI FLOW (MISCELLANEOUS) ×2 IMPLANT
WATER STERILE IRR 1000ML POUR (IV SOLUTION) IMPLANT

## 2017-03-25 NOTE — Anesthesia Procedure Notes (Signed)
Procedure Name: Intubation Date/Time: 03/25/2017 8:52 AM Performed by: Letitia Neri Pre-anesthesia Checklist: Patient identified, Emergency Drugs available, Suction available, Patient being monitored and Timeout performed Patient Re-evaluated:Patient Re-evaluated prior to induction Oxygen Delivery Method: Circle system utilized Preoxygenation: Pre-oxygenation with 100% oxygen Induction Type: IV induction Ventilation: Mask ventilation without difficulty Laryngoscope Size: Mac and 3 Grade View: Grade I Tube type: Oral Tube size: 7.0 mm Number of attempts: 1 Airway Equipment and Method: Stylet Placement Confirmation: ETT inserted through vocal cords under direct vision,  positive ETCO2 and breath sounds checked- equal and bilateral Secured at: 21 cm Tube secured with: Tape Dental Injury: Teeth and Oropharynx as per pre-operative assessment

## 2017-03-25 NOTE — Interval H&P Note (Signed)
History and Physical Interval Note:  03/25/2017 8:25 AM  Kathryn Valencia  has presented today for Laparoscopic cholecystectomy, with the diagnosis of BILIARY DYSKINESIA  The various methods of treatment have been discussed with the patient and family. After consideration of risks, benefits and other options for treatment, the patient has consented to  Procedure(s): LAPAROSCOPIC CHOLECYSTECTOMY (N/A) as a surgical intervention .  The patient's history has been reviewed, patient examined, no change in status, stable for surgery.  Patient without episodes of fever, chills. Still with episodic abdominal pain. I have reviewed the patient's chart and labs.  Questions were answered to the patient's satisfaction.  Will proceed with planned surgical intervention.    Herbert Pun

## 2017-03-25 NOTE — H&P (View-Only) (Signed)
PATIENT PROFILE: Kathryn Valencia is a 31 y.o. female who presents to the Clinic for consultation at the request of Kathryn Valencia for cholecystectomy.  PCP:  Kathryn Hose Achirimofor, MD  HISTORY OF PRESENT ILLNESS: Ms. Kathryn Valencia reports that since many years ago she has been having abdominal pain. The pain is mostly localized to the right upper quadrant and radiates to the back. The patient refers associated nausea and vomiting. Refers it has been getting more frequently, almost every day. Pain may be associated to food but can also presents when she has not eaten. Nothing improves the pain. Denies episodes of fever or chills.    PROBLEM LIST:        Problem List  Date Reviewed: 02/17/2017         Noted   Morbid obesity (CMS-HCC) Unknown   Acoustic neuroma (CMS-HCC) Unknown   Depression, unspecified Unknown   Chronic headaches Unknown   Abdominal pain, generalized 02/17/2017   Non-intractable cyclical vomiting with nausea 02/17/2017      GENERAL REVIEW OF SYSTEMS:   General ROS: negative for - chills, fatigue, fever, weight gain or weight loss Allergy and Immunology ROS: negative for - hives  Hematological and Lymphatic ROS: negative for - bleeding problems or bruising, negative for palpable nodes Endocrine ROS: negative for - heat or cold intolerance, hair changes Respiratory ROS: negative for - cough, shortness of breath or wheezing Cardiovascular ROS: no chest pain or palpitations GI ROS: negative for nausea, vomiting, abdominal pain, diarrhea, constipation Musculoskeletal ROS: negative for - joint swelling or muscle pain Neurological ROS: negative for - confusion, syncope Dermatological ROS: negative for pruritus and rash  MEDICATIONS: CurrentMedications        Current Outpatient Prescriptions  Medication Sig Dispense Refill  . cetirizine (ZYRTEC) 5 MG tablet Take 5 mg by mouth once daily.    Marland Kitchen esomeprazole (NEXIUM) 40 MG DR capsule Take by mouth.    .  fluticasone propionate, micro (FLUTICASONE PROP, MICRO, BULK,) 100 % Powd FLONASE ALLERGY RELIEF 50 MCG/ACT SUSP    . norgestimate-ethinyl estradiol triphasic (ORTHO TRI-CYCLEN LO) 0.18/0.215/0.25 mg-25 mcg tablet ORTHO TRI-CYCLEN LO 0.18/0.215/0.25 MG-25 MCG TABS    . ondansetron (ZOFRAN-ODT) 8 MG disintegrating tablet Take 8 mg by mouth every 8 (eight) hours as needed for Nausea.    . sertraline (ZOLOFT) 100 MG tablet Take 100 mg by mouth once daily.     No current facility-administered medications for this visit.       ALLERGIES: Patient has no known allergies.  PAST MEDICAL HISTORY:     Past Medical History:  Diagnosis Date  . Abdominal pain, generalized 02/17/2017  . Acoustic neuroma (CMS-HCC)   . Chronic headaches   . Depression, unspecified   . Morbid obesity (CMS-HCC)   . Non-intractable cyclical vomiting with nausea 02/17/2017    PAST SURGICAL HISTORY: No past surgical history on file.   FAMILY HISTORY:      Family History  Problem Relation Age of Onset  . No Known Problems Mother   . Diabetes type II Father   . Crohn's disease Father   . Asthma Son   . Autism Son      SOCIAL HISTORY: Social History        Social History  . Marital status: Married    Spouse name: N/A  . Number of children: N/A  . Years of education: N/A   Social History Main Topics  . Smoking status: Never Smoker  . Smokeless tobacco: Never Used  . Alcohol  use No  . Drug use: No  . Sexual activity: Not Asked       Other Topics Concern  . None      Social History Narrative  . None    PHYSICAL EXAM:    Vitals:   03/12/17 1410  BP: 116/75  Pulse: 62  Temp: 36.7 C (98 F)   Body mass index is 45.73 kg/m. Weight: (!) 132.5 kg (292 lb)   General Appearance:    Alert, cooperative, no distress, appears stated age  Head:     Atraumatic, normocephalic  Eyes:   Anciteric, no erythema, no secretions  Neck:   Supple, symmetrical, no JVD, no  palpable lymph nodes  Mouth:   Lips, mucosa, and tongue normal;   Lungs:     Clear to auscultation bilaterally, respirations unlabored   Heart:    Regular rate and rhythm, S1 and S2 normal, no murmur, rub   or gallop  Abdomen:     Soft, non-tender, bowel sounds active all four quadrants,    no masses, no organomegaly. Globose  Extremities:   Extremities normal, atraumatic, no cyanosis or edema  Skin:   Skin color, texture, turgor normal, no rashes or lesions   Neurologic:   Grossly intact.    REVIEW OF DATA: I have reviewed the following data today:      Initial consult on 02/17/2017  Component Date Value  . Helicobacter Pylori Anti* 02/17/2017 Negative   . HCG Quant, Serum Pregnan* 02/17/2017 <0.6      ASSESSMENT: Kathryn Valencia is a 31 y.o. female presenting for consultation for evaluation for cholecystectomy. Patient has been evaluated by GI service multiple times due to this abdominal pain. They even ruled out Crohn's disease since patient has family history of Crohn's. Abdominal ultrasound show no gallstone but HIDA scan show an Ejection Fraction of 8%. Since symptoms are consistent with biliary colic, cholecystectomy was offered to the patient. Patient was oriented that 85% of patient respond to cholecystectomy but a significant amount of patient continue with pain after cholecystectomy. If that the case, repeated endoscopy or even ERCP with sphincterotomy may be considered. Will proceed with Laparoscopic Cholecystectomy.   PLAN: 1. Laparoscopic Cholecystectomy (CPT: [id number]) 2. Avoid fatty food 3. Pre operative workup: CBC, CMP  Patient her mother verbalized understanding, all questions were answered, and were agreeable with the plan outlined above.   Kathryn Pun, MD  Electronically signed by Kathryn Pun, MD  Patient information Handout Cholecystectomy (Surgical Removal of the Gallbladder)  The gallbladder is a small organ under the liver that stores  the bile produced by the liver  Gallstones are hardened digestive fluid that can for in the gallbladder  The procedure is done to remove the gallbladder due to gallstone causing pain or infection  Complications:  Retained common bile duct stone 4%  Bile duct injury or leak 0.5%  Wound infection 1%  Bleeding <1%  Pneumonia 6.7%  Heart complications 6.7%  Blood clots (DVT): 0.2%  Death 0.1%  Alternatives  Watchful waiting is recommended if patient has no symptoms   Low fat diet and exercise

## 2017-03-25 NOTE — Anesthesia Postprocedure Evaluation (Signed)
Anesthesia Post Note  Patient: Kathryn Valencia  Procedure(s) Performed: LAPAROSCOPIC CHOLECYSTECTOMY (N/A Abdomen)  Patient location during evaluation: PACU Anesthesia Type: General Level of consciousness: awake and alert Pain management: pain level controlled Vital Signs Assessment: post-procedure vital signs reviewed and stable Respiratory status: spontaneous breathing, nonlabored ventilation, respiratory function stable and patient connected to nasal cannula oxygen Cardiovascular status: blood pressure returned to baseline and stable Postop Assessment: no apparent nausea or vomiting Anesthetic complications: no     Last Vitals:  Vitals:   03/25/17 1242 03/25/17 1257  BP: (!) 107/56 (!) 114/46  Pulse: 76 70  Resp: 18   Temp: (!) 36.2 C   SpO2: 98% 96%    Last Pain:  Vitals:   03/25/17 1257  TempSrc:   PainSc: Johnson

## 2017-03-25 NOTE — Anesthesia Preprocedure Evaluation (Signed)
Anesthesia Evaluation  Patient identified by MRN, date of birth, ID band Patient awake    Reviewed: Allergy & Precautions, NPO status , Patient's Chart, lab work & pertinent test results, reviewed documented beta blocker date and time   Airway Mallampati: III  TM Distance: >3 FB     Dental  (+) Chipped   Pulmonary           Cardiovascular      Neuro/Psych  Headaches, PSYCHIATRIC DISORDERS Depression  Neuromuscular disease    GI/Hepatic GERD  Controlled,  Endo/Other  Morbid obesity  Renal/GU      Musculoskeletal   Abdominal   Peds  Hematology   Anesthesia Other Findings   Reproductive/Obstetrics                             Anesthesia Physical Anesthesia Plan  ASA: III  Anesthesia Plan: General   Post-op Pain Management:    Induction: Intravenous  PONV Risk Score and Plan:   Airway Management Planned: Oral ETT  Additional Equipment:   Intra-op Plan:   Post-operative Plan:   Informed Consent: I have reviewed the patients History and Physical, chart, labs and discussed the procedure including the risks, benefits and alternatives for the proposed anesthesia with the patient or authorized representative who has indicated his/her understanding and acceptance.     Plan Discussed with: CRNA  Anesthesia Plan Comments:         Anesthesia Quick Evaluation

## 2017-03-25 NOTE — Anesthesia Post-op Follow-up Note (Signed)
Anesthesia QCDR form completed.        

## 2017-03-25 NOTE — Transfer of Care (Signed)
Immediate Anesthesia Transfer of Care Note  Patient: Kathryn Valencia  Procedure(s) Performed: LAPAROSCOPIC CHOLECYSTECTOMY (N/A Abdomen)  Patient Location: PACU  Anesthesia Type:General  Level of Consciousness: sedated  Airway & Oxygen Therapy: Patient Spontanous Breathing  Post-op Assessment: Report given to RN and Post -op Vital signs reviewed and stable  Post vital signs: Reviewed and stable  Last Vitals:  Vitals:   03/25/17 0803 03/25/17 1102  BP: 119/76 114/66  Pulse: 75 93  Resp: 18 (!) 22  Temp: 36.5 C (!) 36.2 C  SpO2: 100% 98%    Last Pain:  Vitals:   03/25/17 0803  TempSrc: Tympanic         Complications: No apparent anesthesia complications

## 2017-03-25 NOTE — Discharge Instructions (Addendum)
°  Diet: Resume home heart healthy regular diet.   Activity: No heavy lifting >20 pounds (children, pets, laundry, garbage) or strenuous activity until follow-up, but light activity and walking are encouraged. Do not drive or drink alcohol if taking narcotic pain medications. No pets allowed close to the wounds.   Wound care: May shower with soapy water and pat dry (do not rub incisions) starting tomorrow, but no baths or submerging incision underwater until follow-up. (no swimming)   Medications: Resume all home medications. For mild to moderate pain: acetaminophen (Tylenol) or ibuprofen (if no kidney disease). Combining Tylenol with alcohol can substantially increase your risk of causing liver disease. Narcotic pain medications, if prescribed, can be used for severe pain, though may cause nausea, constipation, and drowsiness. Do not combine Tylenol and Percocet within a 6 hour period as Percocet contains Tylenol. If you do not need the narcotic pain medication, you do not need to fill the prescription.  Call office [phone]) at any time if any questions, worsening pain, fevers/chills, bleeding, drainage from incision site, or other concerns.    AMBULATORY SURGERY  DISCHARGE INSTRUCTIONS   1) The drugs that you were given will stay in your system until tomorrow so for the next 24 hours you should not:  A) Drive an automobile B) Make any legal decisions C) Drink any alcoholic beverage   2) You may resume regular meals tomorrow.  Today it is better to start with liquids and gradually work up to solid foods.  You may eat anything you prefer, but it is better to start with liquids, then soup and crackers, and gradually work up to solid foods.   3) Please notify your doctor immediately if you have any unusual bleeding, trouble breathing, redness and pain at the surgery site, drainage, fever, or pain not relieved by medication.    4) Additional Instructions:        Please  contact your physician with any problems or Same Day Surgery at [phone], Monday through Friday 6 am to 4 pm, or [place] at [facility] number at [phone].

## 2017-03-25 NOTE — Op Note (Signed)
Preoperative diagnosis: Symptomatic Biliary Dyskinesia  Postoperative diagnosis:Symptomatic Biliary Dyskinesia   Procedure: Laparoscopic Cholecystectomy.   Anesthesia: GETA   Surgeon: Dr. Windell Moment  Wound Classification: Clean Contaminated  Indications: This 31 year old female developed right upper quadrant pain, nausea, vomiting and on workup was found to have symptomatic biliary dyskinesia with gallbladder ejection fraction of 8%. Laparoscopic cholecystectomy was elected.  Findings: 1. Dilated gallbladder. 2. Cystic duct and Cystic artery identified, ligated and divided.  3. Critical view of safety achieved. 4. Adequate hemostasis.   Description of procedure: The patient was placed on the operating table in the supine position. General anesthesia was induced. A time-out was completed verifying correct patient, procedure, site, positioning, and implant(s) and/or special equipment prior to beginning this procedure. An orogastric tube was placed. The abdomen was prepped and draped in the usual sterile fashion.  An incision was made in a natural skin line on the umbilicus.  The fascia was elevated and the Veress needle inserted. Proper position was confirmed by aspiration and saline meniscus test.  The abdomen was insufflated with carbon dioxide to a pressure of 15 mmHg. The patient tolerated insufflation well. A 11-mm trocar was then inserted.  The laparoscope was inserted and the abdomen inspected. No injuries from initial trocar placement were noted. Additional trocars were then inserted in the following locations: a 5-mm trocar in the right epigastrium and two 5-mm trocars along the right costal margin. The abdomen was inspected and no abnormalities were found. The table was placed in the reverse Trendelenburg position with the right side up.  The dome of the gallbladder was grasped with an atraumatic grasper passed through the lateral port and retracted over the dome of the liver. The  infundibulum was also grasped with an atraumatic grasper through the midclavicular port and retracted toward the right lower quadrant. This maneuver exposed Calot's triangle. The peritoneum overlying the gallbladder infundibulum was then incised and the cystic duct and cystic artery identified and circumferentially dissected. The cystic duct and cystic artery were then doubly clipped and divided close to the gallbladder.  The gallbladder was then dissected from its peritoneal attachments by electrocautery. Hemostasis was checked and the gallbladder was removed using an endoscopic retrieval bag placed through the umbilical port. The gallbladder was passed off the table as a specimen. The gallbladder fossa was copiously irrigated with saline and hemostasis was obtained. There was no evidence of bleeding from the gallbladder fossa or cystic artery or leakage of the bile from the cystic duct stump. Secondary trocars were removed under direct vision. No bleeding was noted. The laparoscope was withdrawn and the umbilical trocar removed. The abdomen was allowed to collapse. The fascia of the 45mm trocar sites was closed with figure-of-eight 0 vicryl sutures. The skin was closed with subcuticular sutures of 4-0 monocryl and topical skin adhesive. The orogastric tube was removed.  The patient tolerated the procedure well and was taken to the postanesthesia care unit in stable condition.   Specimen: Gallbladder  Complications: None  EBL: 58mL  Drains: None

## 2017-03-25 NOTE — Progress Notes (Signed)
Nausea better.

## 2017-03-25 NOTE — Progress Notes (Signed)
zofran given for nausea   Spit up mucous  

## 2017-03-26 ENCOUNTER — Emergency Department: Payer: Medicaid Other

## 2017-03-26 ENCOUNTER — Emergency Department
Admission: EM | Admit: 2017-03-26 | Discharge: 2017-03-26 | Disposition: A | Discharge status: Home / Self Care | Payer: Medicaid Other | Attending: Emergency Medicine | Admitting: Emergency Medicine

## 2017-03-26 ENCOUNTER — Encounter: Payer: Self-pay | Admitting: Emergency Medicine

## 2017-03-26 DIAGNOSIS — R1011 Right upper quadrant pain: Secondary | ICD-10-CM | POA: Diagnosis present

## 2017-03-26 DIAGNOSIS — G8918 Other acute postprocedural pain: Secondary | ICD-10-CM | POA: Diagnosis not present

## 2017-03-26 DIAGNOSIS — Z79899 Other long term (current) drug therapy: Secondary | ICD-10-CM | POA: Insufficient documentation

## 2017-03-26 LAB — COMPREHENSIVE METABOLIC PANEL
ALBUMIN: 3.3 g/dL — AB (ref 3.5–5.0)
ALK PHOS: 45 U/L (ref 38–126)
ALT: 63 U/L — AB (ref 14–54)
AST: 103 U/L — AB (ref 15–41)
Anion gap: 9 (ref 5–15)
BILIRUBIN TOTAL: 0.7 mg/dL (ref 0.3–1.2)
BUN: 8 mg/dL (ref 6–20)
CO2: 21 mmol/L — ABNORMAL LOW (ref 22–32)
CREATININE: 0.86 mg/dL (ref 0.44–1.00)
Calcium: 8.8 mg/dL — ABNORMAL LOW (ref 8.9–10.3)
Chloride: 106 mmol/L (ref 101–111)
GFR calc Af Amer: >60 mL/min (ref >60)
GLUCOSE: 100 mg/dL — AB (ref 65–99)
Potassium: 3.7 mmol/L (ref 3.5–5.1)
Sodium: 136 mmol/L (ref 135–145)
TOTAL PROTEIN: 6.6 g/dL (ref 6.5–8.1)

## 2017-03-26 LAB — CBC
HEMATOCRIT: 40.6 % (ref 35.0–47.0)
Hemoglobin: 13.8 g/dL (ref 12.0–16.0)
MCH: 31.3 pg (ref 26.0–34.0)
MCHC: 33.9 g/dL (ref 32.0–36.0)
MCV: 92.4 fL (ref 80.0–100.0)
PLATELETS: 225 10*3/uL (ref 150–440)
RBC: 4.4 MIL/uL (ref 3.80–5.20)
RDW: 13 % (ref 11.5–14.5)
WBC: 6.7 10*3/uL (ref 3.6–11.0)

## 2017-03-26 LAB — TROPONIN I: Troponin I: <0.03 ng/mL (ref <0.03)

## 2017-03-26 LAB — SURGICAL PATHOLOGY

## 2017-03-26 LAB — LIPASE, BLOOD: Lipase: 19 U/L (ref 11–51)

## 2017-03-26 MED ORDER — OXYCODONE-ACETAMINOPHEN 5-325 MG PO TABS: 1.0000 | ORAL_TABLET | Freq: Four times a day (QID) | ORAL | 0 refills | Status: DC | PRN

## 2017-03-26 MED ORDER — MORPHINE SULFATE (PF) 4 MG/ML IV SOLN
4.0000 mg | Freq: Once | INTRAVENOUS | Status: AC
  Administered 2017-03-26: 4 mg via INTRAVENOUS
  Filled 2017-03-26: qty 1

## 2017-03-26 MED ORDER — ONDANSETRON HCL 4 MG/2ML IJ SOLN
4.0000 mg | Freq: Once | INTRAMUSCULAR | Status: AC
  Administered 2017-03-26: 4 mg via INTRAVENOUS
  Filled 2017-03-26: qty 2

## 2017-03-26 MED ORDER — IOPAMIDOL (ISOVUE-300) INJECTION 61%
100.0000 mL | Freq: Once | INTRAVENOUS | Status: AC | PRN
  Administered 2017-03-26: 100 mL via INTRAVENOUS

## 2017-03-26 MED ORDER — IOPAMIDOL (ISOVUE-300) INJECTION 61%
15.0000 mL | INTRAVENOUS | Status: AC
  Administered 2017-03-26 (×2): 15 mL via ORAL

## 2017-03-26 MED ORDER — SODIUM CHLORIDE 0.9 % IV BOLUS (SEPSIS)
1000.0000 mL | Freq: Once | INTRAVENOUS | Status: AC
  Administered 2017-03-26: 1000 mL via INTRAVENOUS

## 2017-03-26 MED ORDER — OXYCODONE-ACETAMINOPHEN 5-325 MG PO TABS
1.0000 | ORAL_TABLET | Freq: Once | ORAL | Status: AC
  Administered 2017-03-26: 1 via ORAL
  Filled 2017-03-26: qty 1

## 2017-03-26 MED ORDER — MORPHINE SULFATE (PF) 4 MG/ML IV SOLN: 4.0000 mg | Freq: Once | INTRAVENOUS | Status: DC | PRN

## 2017-03-26 NOTE — ED Provider Notes (Signed)
[facility] Emergency Department Provider Note ____________________________________________   I have reviewed the triage vital signs and the triage nursing note.  HISTORY  Chief Complaint Abdominal Pain   Historian Patient  HPI Kathryn Valencia is a 31 y.o. female Presents for acute right-sided abdominal pain especially in the right upper quadrant and also into the umbilicus after she had a routine outpatient cholecystectomy performed yesterday. She went home the same day with tramadol and reports severe pain overnight and currently 10 out of 10 pain.  No vomiting or bowel changes. No fever. Movement of her torso seems to make it worse. Breathing makes it worse especially in the right upper quadrant.    Past Medical History:  Diagnosis Date  . Acoustic neuroma ([place]) 01/2015  . Chronic headaches   . Depression   . GERD (gastroesophageal reflux disease)   . Morbid obesity ([place])   . UTI (urinary tract infection) 03/20/2017   DX IN ED ON 03-20-17 AND IS CURRENTLY TAKING BACTRIM X 3 DAYS BID    There are no active problems to display for this patient.   Past Surgical History:  Procedure Laterality Date  . CHOLECYSTECTOMY N/A 03/25/2017   Procedure: LAPAROSCOPIC CHOLECYSTECTOMY;  Surgeon: Herbert Pun, MD;  Location: [facility] ORS;  Service: General;  Laterality: N/A;  . NO PAST SURGERIES      Prior to Admission medications   Medication Sig Start Date End Date Taking? Authorizing Provider  ondansetron (ZOFRAN) 4 MG tablet Take 1 tablet (4 mg total) by mouth daily as needed for nausea or vomiting. 02/27/17 02/27/18 Yes Darel Hong, MD  sertraline (ZOLOFT) 25 MG tablet Take 25 mg by mouth every morning.  08/19/16  Yes [provider]  traMADol (ULTRAM) 50 MG tablet Take 1 tablet (50 mg total) by mouth every 6 (six) hours as needed for moderate pain or severe pain. 03/25/17 03/28/17 Yes Herbert Pun, MD  acetaminophen (TYLENOL) 500  MG tablet Take 1,000 mg by mouth every 6 (six) hours as needed for mild pain or moderate pain.    [provider]  benzonatate (TESSALON PERLES) 100 MG capsule Take 2 capsules (200 mg total) by mouth 3 (three) times daily as needed. Patient not taking: Reported on 03/17/2017 07/26/16 07/26/17  Johnn Hai, PA-C  cetirizine (ZYRTEC) 5 MG tablet Take 5 mg by mouth daily as needed for allergies.    [provider]  esomeprazole (NEXIUM) 20 MG capsule Take 20 mg by mouth every morning. Continuous     [provider]  famotidine (PEPCID) 20 MG tablet Take 1 tablet (20 mg total) by mouth 2 (two) times daily. Patient not taking: Reported on 03/17/2017 01/04/17 01/04/18  Rudene Re, MD  Fluticasone Propionate POWD Place 1 spray into both nostrils daily as needed for allergies.  11/29/12   [provider]  Multiple Vitamins-Minerals (MULTIVITAMIN WITH MINERALS) tablet Take 1 tablet by mouth daily.    [provider]  Norgestimate-Ethinyl Estradiol Triphasic 0.18/0.215/0.25 MG-25 MCG tab Take 1 tablet by mouth daily. 01/28/17   [provider]  oxyCODONE-acetaminophen (ROXICET) 5-325 MG tablet Take 1-2 tablets by mouth every 6 (six) hours as needed for severe pain. 03/26/17   Lisa Roca, MD    No Known Allergies  History reviewed. No pertinent family history.  Social History Social History  Substance Use Topics  . Smoking status: Never Smoker  . Smokeless tobacco: Never Used  . Alcohol use No    Review of Systems  Constitutional: Negative  for fever. Eyes: Negative for visual changes. ENT: Negative for sore throat. Cardiovascular: Negative for chest pain. Respiratory: Negative for shortness of breath. Gastrointestinal: positive for abdominal pain as per history of present illness. Genitourinary: Negative for dysuria. Musculoskeletal: Negative for back pain. Skin: Negative for rash. Neurological: Negative for  headache.  ____________________________________________   PHYSICAL EXAM:  VITAL SIGNS: ED Triage Vitals  Enc Vitals Group     BP 03/26/17 0728 110/78     Pulse Rate 03/26/17 0722 69     Resp 03/26/17 0722 20     Temp 03/26/17 0722 98.5 F (36.9 C)     Temp Source 03/26/17 0722 Oral     SpO2 03/26/17 0722 99 %     Weight 03/26/17 0721 288 lb (130.6 kg)     Height 03/26/17 0721 5\' 7"  (1.702 m)     Head Circumference --      Peak Flow --      Pain Score 03/26/17 0720 10     Pain Loc --      Pain Edu? --      Excl. in [place]? --      Constitutional: Alert and oriented. flushed cheeks and looks like she doesn't feel well, but no acute distress. HEENT   Head: Normocephalic and atraumatic.      Eyes: Conjunctivae are normal. Pupils equal and round.       Ears:         Nose: No congestion/rhinnorhea.   Mouth/Throat: Mucous membranes are moist.   Neck: No stridor. Cardiovascular/Chest: Normal rate, regular rhythm.  No murmurs, rubs, or gallops. Respiratory: Normal respiratory effort without tachypnea nor retractions. Breath sounds are clear and equal bilaterally. No wheezes/rales/rhonchi. Gastrointestinal: obese.  Soft. No distention, no guarding, no rebound. parents in the right upper quadrant and also the mid abdomen around the umbilicus. The laparoscopic surgical sites look normal appearance.  Genitourinary/rectal:Deferred Musculoskeletal: Nontender with normal range of motion in all extremities. No joint effusions.  No lower extremity tenderness.  No edema. Neurologic:  Normal speech and language. No gross or focal neurologic deficits are appreciated. Skin:  Skin is warm, dry and intact. No rash noted. Psychiatric: Mood and affect are normal. Speech and behavior are normal. Patient exhibits appropriate insight and judgment.   ____________________________________________  LABS (pertinent positives/negatives) I, Lisa Roca, MD the attending physician have reviewed the  labs noted below.  Labs Reviewed  COMPREHENSIVE METABOLIC PANEL - Abnormal; Notable for the following:       Result Value   CO2 21 (*)    Glucose, Bld 100 (*)    Calcium 8.8 (*)    Albumin 3.3 (*)    AST 103 (*)    ALT 63 (*)    All other components within normal limits  LIPASE, BLOOD  CBC  TROPONIN I  URINALYSIS, COMPLETE (UACMP) WITH MICROSCOPIC    ____________________________________________    EKG I, Lisa Roca, MD, the attending physician have personally viewed and interpreted all ECGs.  57 bpm. Normal sinus rhythm. Narrow QRS. Normal axis. Normal ST and T-wave ____________________________________________  RADIOLOGY All Xrays were viewed by me.  Imaging interpreted by Radiologist, and I, Lisa Roca, MD the attending physician have reviewed the radiologist interpretation noted below.  CT abdomen and pelvis with contrast:  IMPRESSION: Changes consistent with recent cholecystectomy with a small amount of subcutaneous and free intraperitoneal air. A minimal amount of fluid is noted in the gallbladder fossa likely related to the recent surgery. No definitive abscess is  seen.  Bibasilar atelectatic changes.  No other focal abnormality is noted. __________________________________________  PROCEDURES  Procedure(s) performed: None  Critical Care performed: None  ____________________________________________  No current facility-administered medications on file prior to encounter.    Current Outpatient Prescriptions on File Prior to Encounter  Medication Sig Dispense Refill  . ondansetron (ZOFRAN) 4 MG tablet Take 1 tablet (4 mg total) by mouth daily as needed for nausea or vomiting. 30 tablet 1  . sertraline (ZOLOFT) 25 MG tablet Take 25 mg by mouth every morning.     . traMADol (ULTRAM) 50 MG tablet Take 1 tablet (50 mg total) by mouth every 6 (six) hours as needed for moderate pain or severe pain. 12 tablet 0  . acetaminophen (TYLENOL) 500 MG tablet Take  1,000 mg by mouth every 6 (six) hours as needed for mild pain or moderate pain.    . benzonatate (TESSALON PERLES) 100 MG capsule Take 2 capsules (200 mg total) by mouth 3 (three) times daily as needed. (Patient not taking: Reported on 03/17/2017) 30 capsule 0  . cetirizine (ZYRTEC) 5 MG tablet Take 5 mg by mouth daily as needed for allergies.    Marland Kitchen esomeprazole (NEXIUM) 20 MG capsule Take 20 mg by mouth every morning. Continuous     . famotidine (PEPCID) 20 MG tablet Take 1 tablet (20 mg total) by mouth 2 (two) times daily. (Patient not taking: Reported on 03/17/2017) 60 tablet 0  . Fluticasone Propionate POWD Place 1 spray into both nostrils daily as needed for allergies.     . Multiple Vitamins-Minerals (MULTIVITAMIN WITH MINERALS) tablet Take 1 tablet by mouth daily.    . Norgestimate-Ethinyl Estradiol Triphasic 0.18/0.215/0.25 MG-25 MCG tab Take 1 tablet by mouth daily.      ____________________________________________  ED COURSE / ASSESSMENT AND PLAN  Pertinent labs & imaging results that were available during my care of the patient were reviewed by me and considered in my medical decision making (see chart for details).   Ms. Dionisio is here with uncontrolled pain after cholecystectomy yesterday. Patient will be started on symptomatic medication for pain, and also checking labs and likely CT to investigate for any postsurgical complications.  CT without evidence for complication of surgery.  Will try to obtain pain control and escalate home pain management to percocet for short duration immediately post surgical pain control.  she does describe some mild pleuritic component to the right side, but no shortness of breath or hypoxia or fever or tachycardia, and she has pain directly over the right upper quadrant, and I feel like the symptoms are actually due to postoperative pain rather than risk for PE but we did discuss this in terms of looking for any return precautions.  Reviewed [place]  drug database - only rx for post operative tramadol.  Gave narcotic precautions.   DIFFERENTIAL DIAGNOSIS: Differential diagnosis includes, but is not limited to, complications of gb surgery, intrathoracic causes for epigastric abdominal pain including ACS, gastritis, duodenitis, pancreatitis, small bowel or large bowel obstruction, abdominal aortic aneurysm, hernia, and gastritis.   CONSULTATIONS:  Spoke with Dr. Peyton Najjar, patients surgeon, reviewed reassuring labs, ok for dc with escalation of pain medication.   Patient / Family / Caregiver informed of clinical course, medical decision-making process, and agree with plan.   I discussed return precautions, follow-up instructions, and discharge instructions with patient and/or family.  Discharge Instructions : You were treated for post operative pain after gall bladder removal.  No serious complication is suspected after your  evaluation are reassuring overall in the ER today.  You may try new pain RX oxycodone/acetaminophen --do not take with tylenol or tramadol.  return to the emergency department immediately for any new or worsening or uncontrolled pain, vomiting, fever, or any other symptoms concerning to you.  ___________________________________________   FINAL CLINICAL IMPRESSION(S) / ED DIAGNOSES   Final diagnoses:  Acute post-operative pain              Note: This dictation was prepared with Dragon dictation. Any transcriptional errors that result from this process are unintentional    Lisa Roca, MD 03/26/17 1218

## 2017-03-26 NOTE — ED Notes (Signed)
Patient transported to CT 

## 2017-03-26 NOTE — ED Notes (Signed)
Pt presents today with incisional pain r/t to gallbladder surgery 03/25/17. Pt states it became unbearable at 0200. Pt states she got tramadol and it is not covering pain, tylenol also about 0230. Family is at bedside.

## 2017-03-26 NOTE — ED Notes (Signed)
Family at bedside. 

## 2017-03-26 NOTE — Discharge Instructions (Signed)
You were treated for post operative pain after gall bladder removal.  No serious complication is suspected after your evaluation are reassuring overall in the ER today.  You may try new pain RX oxycodone/acetaminophen --do not take with tylenol or tramadol.  return to the emergency department immediately for any new or worsening or uncontrolled pain, vomiting, fever, or any other symptoms concerning to you.

## 2017-04-15 HISTORY — PX: EAR CYST EXCISION: SHX22

## 2017-04-22 ENCOUNTER — Other Ambulatory Visit: Payer: Self-pay

## 2017-04-22 ENCOUNTER — Emergency Department
Admission: EM | Admit: 2017-04-22 | Discharge: 2017-04-22 | Disposition: A | Discharge status: Home / Self Care | Payer: Medicaid Other | Attending: Emergency Medicine | Admitting: Emergency Medicine

## 2017-04-22 DIAGNOSIS — R109 Unspecified abdominal pain: Secondary | ICD-10-CM | POA: Diagnosis not present

## 2017-04-22 DIAGNOSIS — Z5321 Procedure and treatment not carried out due to patient leaving prior to being seen by health care provider: Secondary | ICD-10-CM | POA: Insufficient documentation

## 2017-04-22 DIAGNOSIS — K6289 Other specified diseases of anus and rectum: Secondary | ICD-10-CM | POA: Diagnosis not present

## 2017-04-22 NOTE — ED Notes (Signed)
Pt left after triage

## 2017-04-22 NOTE — ED Triage Notes (Signed)
Pt states she had surgery to remove a cyst from inside the ear at [facility] last Wednesday and was on oxycodone . States she has not had a BM since last week and is having severe abd pain and rectal pain. Pt states they did not instruct her to take anything to help with constipation.

## 2017-05-26 ENCOUNTER — Emergency Department
Admission: EM | Admit: 2017-05-26 | Discharge: 2017-05-26 | Disposition: A | Discharge status: Home / Self Care | Payer: Medicaid Other | Attending: Emergency Medicine | Admitting: Emergency Medicine

## 2017-05-26 ENCOUNTER — Encounter: Payer: Self-pay | Admitting: Intensive Care

## 2017-05-26 DIAGNOSIS — N390 Urinary tract infection, site not specified: Secondary | ICD-10-CM | POA: Insufficient documentation

## 2017-05-26 DIAGNOSIS — Z79899 Other long term (current) drug therapy: Secondary | ICD-10-CM | POA: Diagnosis not present

## 2017-05-26 DIAGNOSIS — G43809 Other migraine, not intractable, without status migrainosus: Secondary | ICD-10-CM | POA: Diagnosis not present

## 2017-05-26 DIAGNOSIS — R319 Hematuria, unspecified: Secondary | ICD-10-CM | POA: Insufficient documentation

## 2017-05-26 DIAGNOSIS — R51 Headache: Secondary | ICD-10-CM | POA: Diagnosis present

## 2017-05-26 LAB — COMPREHENSIVE METABOLIC PANEL
ALT: 13 U/L — AB (ref 14–54)
AST: 19 U/L (ref 15–41)
Albumin: 3.5 g/dL (ref 3.5–5.0)
Alkaline Phosphatase: 57 U/L (ref 38–126)
Anion gap: 8 (ref 5–15)
BUN: 8 mg/dL (ref 6–20)
CHLORIDE: 106 mmol/L (ref 101–111)
CO2: 25 mmol/L (ref 22–32)
CREATININE: 0.64 mg/dL (ref 0.44–1.00)
Calcium: 8.9 mg/dL (ref 8.9–10.3)
GFR calc non Af Amer: >60 mL/min (ref >60)
Glucose, Bld: 84 mg/dL (ref 65–99)
POTASSIUM: 3.6 mmol/L (ref 3.5–5.1)
SODIUM: 139 mmol/L (ref 135–145)
Total Bilirubin: 0.3 mg/dL (ref 0.3–1.2)
Total Protein: 6.9 g/dL (ref 6.5–8.1)

## 2017-05-26 LAB — URINALYSIS, COMPLETE (UACMP) WITH MICROSCOPIC
BILIRUBIN URINE: NEGATIVE
Glucose, UA: NEGATIVE mg/dL
Ketones, ur: NEGATIVE mg/dL
Nitrite: NEGATIVE
PROTEIN: 30 mg/dL — AB
Specific Gravity, Urine: 1.015 (ref 1.005–1.030)
pH: 6 (ref 5.0–8.0)

## 2017-05-26 LAB — CBC
HEMATOCRIT: 38.7 % (ref 35.0–47.0)
HEMOGLOBIN: 13.1 g/dL (ref 12.0–16.0)
MCH: 31.1 pg (ref 26.0–34.0)
MCHC: 33.9 g/dL (ref 32.0–36.0)
MCV: 91.6 fL (ref 80.0–100.0)
PLATELETS: 252 10*3/uL (ref 150–440)
RBC: 4.23 MIL/uL (ref 3.80–5.20)
RDW: 13.4 % (ref 11.5–14.5)
WBC: 5.9 10*3/uL (ref 3.6–11.0)

## 2017-05-26 LAB — POCT PREGNANCY, URINE: Preg Test, Ur: NEGATIVE

## 2017-05-26 LAB — LIPASE, BLOOD: LIPASE: 30 U/L (ref 11–51)

## 2017-05-26 MED ORDER — KETOROLAC TROMETHAMINE 30 MG/ML IJ SOLN: 15.0000 mg | Freq: Once | INTRAMUSCULAR | Status: DC

## 2017-05-26 MED ORDER — METOCLOPRAMIDE HCL 5 MG/ML IJ SOLN: 10.0000 mg | INTRAMUSCULAR | Status: DC

## 2017-05-26 MED ORDER — CEPHALEXIN 500 MG PO CAPS
500.0000 mg | ORAL_CAPSULE | Freq: Two times a day (BID) | ORAL | 0 refills | Status: DC
Start: 2017-05-26 — End: 2018-11-22

## 2017-05-26 MED ORDER — DEXAMETHASONE SODIUM PHOSPHATE 10 MG/ML IJ SOLN: 10.0000 mg | Freq: Once | INTRAMUSCULAR | Status: DC

## 2017-05-26 MED ORDER — BUTALBITAL-APAP-CAFFEINE 50-325-40 MG PO TABS: 1.0000 | ORAL_TABLET | Freq: Four times a day (QID) | ORAL | 0 refills | Status: AC | PRN

## 2017-05-26 MED ORDER — SODIUM CHLORIDE 0.9 % IV BOLUS (SEPSIS): 500.0000 mL | INTRAVENOUS | Status: DC

## 2017-05-26 MED ORDER — DIPHENHYDRAMINE HCL 50 MG/ML IJ SOLN: 12.5000 mg | INTRAMUSCULAR | Status: DC

## 2017-05-26 NOTE — ED Provider Notes (Signed)
[facility] Emergency Department Provider Note  ____________________________________________   First MD Initiated Contact with Patient 05/26/17 1303     (approximate)  I have reviewed the triage vital signs and the nursing notes.   HISTORY  Chief Complaint Headache    HPI Kathryn Valencia is a 31 y.o. female with medical history as listed below who presents for evaluation of a headache for more than a month.  She says it has been worse over the last week or so.  She had an acoustic neuroma removed at [facility] just over 1 month ago and said that she has had left-sided headaches ever since then.  At times the headaches will go away but they will come back.  It is primarily sharp and throbbing pain in the left side of her head, frequently with light sensitivity and nausea, occasionally with some blurry vision.  She saw her doctor at [facility] yesterday and told him of the symptoms and he reassured her that it was nothing new or acute or different, but she felt she needed to be evaluated again, so she came to the [facility] ED today.  Her symptoms are actually better today than they have been previously but she still rates them as severe.  She denies fever/chills, neck pain, neck stiffness, vomiting, and abdominal pain.  Past Medical History:  Diagnosis Date  . Acoustic neuroma ([place]) 01/2015  . Chronic headaches   . Depression   . GERD (gastroesophageal reflux disease)   . Morbid obesity ([place])   . UTI (urinary tract infection) 03/20/2017   DX IN ED ON 03-20-17 AND IS CURRENTLY TAKING BACTRIM X 3 DAYS BID    There are no active problems to display for this patient.   Past Surgical History:  Procedure Laterality Date  . CHOLECYSTECTOMY N/A 03/25/2017   Procedure: LAPAROSCOPIC CHOLECYSTECTOMY;  Surgeon: Herbert Pun, MD;  Location: [facility] ORS;  Service: General;  Laterality: N/A;  . NO PAST SURGERIES      Prior to Admission medications   Medication Sig Start Date  End Date Taking? Authorizing Provider  cetirizine (ZYRTEC) 5 MG tablet Take 5 mg by mouth daily as needed for allergies.   Yes [provider]  esomeprazole (NEXIUM) 20 MG capsule Take 20 mg by mouth every morning. Continuous    Yes [provider]  fluticasone (FLONASE) 50 MCG/ACT nasal spray Place 2 sprays into both nostrils daily.   Yes [provider]  Multiple Vitamins-Minerals (MULTIVITAMIN WITH MINERALS) tablet Take 1 tablet by mouth daily.   Yes [provider]  Norgestimate-Ethinyl Estradiol Triphasic 0.18/0.215/0.25 MG-25 MCG tab Take 1 tablet by mouth daily. 01/28/17  Yes [provider]  sertraline (ZOLOFT) 25 MG tablet Take 25 mg by mouth every morning.  08/19/16  Yes [provider]  acetaminophen (TYLENOL) 500 MG tablet Take 1,000 mg by mouth every 6 (six) hours as needed for mild pain or moderate pain.    [provider]  benzonatate (TESSALON PERLES) 100 MG capsule Take 2 capsules (200 mg total) by mouth 3 (three) times daily as needed. Patient not taking: Reported on 03/17/2017 07/26/16 07/26/17  Johnn Hai, PA-C  butalbital-acetaminophen-caffeine [place]) [phone] MG tablet Take 1-2 tablets by mouth every 6 (six) hours as needed for headache. 05/26/17 05/26/18  Hinda Kehr, MD  cephALEXin (KEFLEX) 500 MG capsule Take 1 capsule (500 mg total) by mouth 2 (two) times daily. 05/26/17   Hinda Kehr, MD  famotidine (PEPCID) 20 MG tablet Take  1 tablet (20 mg total) by mouth 2 (two) times daily. Patient not taking: Reported on 03/17/2017 01/04/17 01/04/18  Rudene Re, MD  ondansetron [facility] [address]) 4 MG tablet Take 1 tablet (4 mg total) by mouth daily as needed for nausea or vomiting. 02/27/17 02/27/18  Darel Hong, MD  oxyCODONE-acetaminophen (ROXICET) 5-325 MG tablet Take 1-2 tablets by mouth every 6 (six) hours as needed for severe pain. Patient not taking: Reported on 05/26/2017 03/26/17   Lisa Roca, MD     Allergies Patient has no known allergies.  History reviewed. No pertinent family history.  Social History Social History   Tobacco Use  . Smoking status: Never Smoker  . Smokeless tobacco: Never Used  Substance Use Topics  . Alcohol use: No  . Drug use: No    Review of Systems Constitutional: No fever/chills Eyes: Occasional blurry vision with headaches ENT: No sore throat. Cardiovascular: Denies chest pain. Respiratory: Denies shortness of breath. Gastrointestinal: No abdominal pain.  No nausea, no vomiting.  No diarrhea.  No constipation. Genitourinary: Negative for dysuria. Musculoskeletal: Negative for neck pain.  Negative for back pain. Integumentary: Negative for rash. Neurological: Frequent, almost daily headaches since left-sided acoustic neuroma surgery over a month ago at [facility].  Frequently associated with photophobia.  No focal numbness nor weakness   ____________________________________________   PHYSICAL EXAM:  VITAL SIGNS: ED Triage Vitals  Enc Vitals Group     BP 05/26/17 1112 (!) 116/59     Pulse Rate 05/26/17 1112 (!) 55     Resp 05/26/17 1112 16     Temp 05/26/17 1112 97.8 F (36.6 C)     Temp Source 05/26/17 1112 Oral     SpO2 05/26/17 1112 99 %     Weight 05/26/17 1113 127 kg (280 lb)     Height 05/26/17 1113 1.702 m (5\' 7" )     Head Circumference --      Peak Flow --      Pain Score 05/26/17 1112 7     Pain Loc --      Pain Edu? --      Excl. in [place]? --     Constitutional: Alert and oriented. Well appearing and in no acute distress. Eyes: Conjunctivae are normal. PERRL. EOMI. No papilledema on funduscopic exam Head: Atraumatic. Neck: No stridor.  No meningeal signs.   Cardiovascular: Normal rate, regular rhythm. Good peripheral circulation. Grossly normal heart sounds. Respiratory: Normal respiratory effort.  No retractions. Lungs CTAB. Gastrointestinal: Soft and nontender. No distention.  Musculoskeletal: No lower extremity  tenderness nor edema. No gross deformities of extremities. Neurologic:  Normal speech and language. No gross focal neurologic deficits are appreciated.  Skin:  Skin is warm, dry and intact. No rash noted. Psychiatric: Mood and affect are normal. Speech and behavior are normal.  ____________________________________________   LABS (all labs ordered are listed, but only abnormal results are displayed)  Labs Reviewed  COMPREHENSIVE METABOLIC PANEL - Abnormal; Notable for the following components:      Result Value   ALT 13 (*)    All other components within normal limits  URINALYSIS, COMPLETE (UACMP) WITH MICROSCOPIC - Abnormal; Notable for the following components:   Color, Urine YELLOW (*)    APPearance CLOUDY (*)    Hgb urine dipstick SMALL (*)    Protein, ur 30 (*)    Leukocytes, UA LARGE (*)    Bacteria, UA MANY (*)    Squamous Epithelial / LPF 6-30 (*)    All other components  within normal limits  URINE CULTURE  LIPASE, BLOOD  CBC  POC URINE PREG, ED  POCT PREGNANCY, URINE   ____________________________________________  EKG  None - EKG not ordered by ED physician ____________________________________________  RADIOLOGY   No results found.  ____________________________________________   PROCEDURES  Critical Care performed: No   Procedure(s) performed:   Procedures   ____________________________________________   INITIAL IMPRESSION / ASSESSMENT AND PLAN / ED COURSE  As part of my medical decision making, I reviewed the following data within the [place] notes reviewed and incorporated, Labs reviewed , Old chart reviewed and Notes from prior ED visits    Differential diagnosis includes, but is not limited to, intracranial hemorrhage, meningitis/encephalitis, previous head trauma, cavernous venous thrombosis, tension headache, temporal arteritis, migraine or migraine equivalent, idiopathic intracranial hypertension, and  non-specific headache.  Given that the patient has had intermittent headaches since her surgery, I find it likely that she is either suffering from normal postoperative discomfort as she was told by her surgeon yesterday, or she is having recurrent migraines, which is consistent with the symptoms she described.  I explained that I do not recommend additional imaging today since she is being followed by surgeon at [facility] and there is no indication of a new problem.  I suggested we place an IV and treat her empirically for a migraine which I do believe will be helpful for her.  She agreed with the plan and then I will reassess after treatment.  Clinical Course as of May 27 2315  Tue May 26, 2017  1445 Patient is refusing an IV and wants a prescription to take home.  Multiple critical patients at this time, will talk with her when I have the opportunity. Nurse explained need for IV and migraine treatment.  [CF]  1510 I discussed with the patient and she said that her headache is actually very mild right now and she just wants something she can try to take at home.  I explained that IV treatment would be much more effective but she does not want an IV.  I will give her prescription for Fioricet and encouraged her to follow up as an outpatient as soon as possible.  I gave my usual and customary return precautions.  [CF]  1513 We will also treat empirically for what appears to be a positive urinalysis.  Urine culture is pending.  [CF]    Clinical Course User Index [CF] Hinda Kehr, MD    ____________________________________________  FINAL CLINICAL IMPRESSION(S) / ED DIAGNOSES  Final diagnoses:  Other migraine without status migrainosus, not intractable  Urinary tract infection with hematuria, site unspecified     MEDICATIONS GIVEN DURING THIS VISIT:  Medications - No data to display   ED Discharge Orders        Ordered    butalbital-acetaminophen-caffeine (FIORICET, ESGIC) 50-325-40 MG tablet   Every 6 hours PRN     05/26/17 1514    cephALEXin (KEFLEX) 500 MG capsule  2 times daily     05/26/17 1514       Note:  This document was prepared using Dragon voice recognition software and may include unintentional dictation errors.    Hinda Kehr, MD 05/26/17 [phone]

## 2017-05-26 NOTE — Discharge Instructions (Signed)

## 2017-05-26 NOTE — ED Triage Notes (Signed)
Patient reports having a cyst removed last month at [facility] on the L side of her head and ever since experiencing headaches. Reports some vision changes since also. C/O nausea off and on for a couple of weeks. A&O x4 in triage. No problems ambulating

## 2017-05-26 NOTE — ED Notes (Signed)
Pt refusing IV EDP aware.

## 2017-05-26 NOTE — ED Notes (Signed)
Pt states she is having severe headaches. HA is so bad that she could not see. Pt states [facility] says this is normal. Pt states she saw him yesterday. Pt wants a second opinion. Pt is NAD awaiting EDP.

## 2017-05-27 LAB — URINE CULTURE
CULTURE: NO GROWTH
SPECIAL REQUESTS: NORMAL

## 2017-06-12 ENCOUNTER — Other Ambulatory Visit
Admission: RE | Admit: 2017-06-12 | Discharge: 2017-06-12 | Disposition: A | Discharge status: Home / Self Care | Payer: Medicaid Other | Source: Ambulatory Visit | Attending: Nurse Practitioner | Admitting: Nurse Practitioner

## 2017-06-12 DIAGNOSIS — R197 Diarrhea, unspecified: Secondary | ICD-10-CM | POA: Diagnosis present

## 2017-06-12 LAB — C DIFFICILE QUICK SCREEN W PCR REFLEX
C Diff antigen: NEGATIVE
C Diff interpretation: NOT DETECTED
C Diff toxin: NEGATIVE

## 2017-06-15 LAB — CALPROTECTIN, FECAL: Calprotectin, Fecal: 21 ug/g (ref 0–120)

## 2017-07-01 ENCOUNTER — Other Ambulatory Visit: Payer: Self-pay

## 2017-07-01 ENCOUNTER — Emergency Department
Admission: EM | Admit: 2017-07-01 | Discharge: 2017-07-01 | Disposition: A | Discharge status: Home / Self Care | Payer: Medicaid Other | Attending: Emergency Medicine | Admitting: Emergency Medicine

## 2017-07-01 DIAGNOSIS — R300 Dysuria: Secondary | ICD-10-CM | POA: Diagnosis not present

## 2017-07-01 DIAGNOSIS — R51 Headache: Secondary | ICD-10-CM | POA: Diagnosis not present

## 2017-07-01 DIAGNOSIS — Z79899 Other long term (current) drug therapy: Secondary | ICD-10-CM | POA: Diagnosis not present

## 2017-07-01 DIAGNOSIS — R519 Headache, unspecified: Secondary | ICD-10-CM

## 2017-07-01 LAB — POCT PREGNANCY, URINE: PREG TEST UR: NEGATIVE

## 2017-07-01 MED ORDER — PROCHLORPERAZINE EDISYLATE 5 MG/ML IJ SOLN
10.0000 mg | Freq: Once | INTRAMUSCULAR | Status: AC
  Administered 2017-07-01: 10 mg via INTRAVENOUS
  Filled 2017-07-01: qty 2

## 2017-07-01 MED ORDER — SODIUM CHLORIDE 0.9 % IV BOLUS (SEPSIS)
1000.0000 mL | Freq: Once | INTRAVENOUS | Status: AC
  Administered 2017-07-01: 1000 mL via INTRAVENOUS

## 2017-07-01 MED ORDER — PROCHLORPERAZINE MALEATE 10 MG PO TABS: 10.0000 mg | ORAL_TABLET | Freq: Four times a day (QID) | ORAL | 0 refills | Status: DC | PRN

## 2017-07-01 NOTE — ED Triage Notes (Signed)
Pt reports that she is having headaches off and on for the last week with no relief from OTC of RX meds  Pt also had surgery 2 months (cyst inside left ear removed) and since surgery having left sided facial weakness and numbness Pt also states that she has vaginal pain with wiping and increased milky/yellow discharge for 2 weeks - she was tx for UTI and been off meds x3 days

## 2017-07-01 NOTE — Discharge Instructions (Signed)
Please seek medical attention for any high fevers, chest pain, shortness of breath, change in behavior, persistent vomiting, bloody stool or any other new or concerning symptoms.  

## 2017-07-01 NOTE — ED Notes (Signed)
Pt states HA that "lasts all day and night," pt states left side head pain. Pt states she had "surgery to remove cyst in left ear" and reports continued pain post surgery. Pt states she is not able to lie on left side due to pain. Pt denies vision changes, or sensitivity to lights. Pt denies taking medicine for the HA. Pt A&O at this time.  Pt reports she is not sure what medication she was on for UTI, pt states she was told to take the pill 3x a day for 3 days and reports if symptoms continued to continue taking medicine. Pt states the vaginal discharge has not improved, reports discharge is "white/milky".

## 2017-07-01 NOTE — ED Provider Notes (Signed)
[facility] Emergency Department Provider Note   ____________________________________________   I have reviewed the triage vital signs and the nursing notes.   HISTORY  Chief Complaint Headache  History limited by: Not Limited   HPI Kathryn Valencia is a 32 y.o. female who presents to the emergency department today because of concern for headache.  Is located on the left side.  It has been present for the past 2 months.  It started after the patient had a acoustic neuroma removed.  She states it is severe.  States it is worse when she tries to lie on it.  She has been trying Tylenol without any significant relief.  Does not sound like there are any new or particularly different symptoms today however it was just severe.  She is not recently contacted her neurosurgeon about this.  In addition she also has some clinic complaints of continued urinary tract infection.  She describes continued dysuria.   Per medical record review patient has a history of acoustic neuroma.  Past Medical History:  Diagnosis Date  . Acoustic neuroma ([place]) 01/2015  . Chronic headaches   . Depression   . GERD (gastroesophageal reflux disease)   . Morbid obesity ([place])   . UTI (urinary tract infection) 03/20/2017   DX IN ED ON 03-20-17 AND IS CURRENTLY TAKING BACTRIM X 3 DAYS BID    There are no active problems to display for this patient.   Past Surgical History:  Procedure Laterality Date  . CHOLECYSTECTOMY N/A 03/25/2017   Procedure: LAPAROSCOPIC CHOLECYSTECTOMY;  Surgeon: Herbert Pun, MD;  Location: [facility] ORS;  Service: General;  Laterality: N/A;  . NO PAST SURGERIES      Prior to Admission medications   Medication Sig Start Date End Date Taking? Authorizing Provider  acetaminophen (TYLENOL) 500 MG tablet Take 1,000 mg by mouth every 6 (six) hours as needed for mild pain or moderate pain.    [provider]  benzonatate (TESSALON PERLES) 100 MG capsule  Take 2 capsules (200 mg total) by mouth 3 (three) times daily as needed. Patient not taking: Reported on 03/17/2017 07/26/16 07/26/17  Johnn Hai, PA-C  butalbital-acetaminophen-caffeine [place]) [id number] MG tablet Take 1-2 tablets by mouth every 6 (six) hours as needed for headache. 05/26/17 05/26/18  Hinda Kehr, MD  cephALEXin (KEFLEX) 500 MG capsule Take 1 capsule (500 mg total) by mouth 2 (two) times daily. 05/26/17   Hinda Kehr, MD  cetirizine (ZYRTEC) 5 MG tablet Take 5 mg by mouth daily as needed for allergies.    [provider]  esomeprazole (NEXIUM) 20 MG capsule Take 20 mg by mouth every morning. Continuous     [provider]  famotidine (PEPCID) 20 MG tablet Take 1 tablet (20 mg total) by mouth 2 (two) times daily. Patient not taking: Reported on 03/17/2017 01/04/17 01/04/18  Rudene Re, MD  fluticasone [facility]) 50 MCG/ACT nasal spray Place 2 sprays into both nostrils daily.    [provider]  Multiple Vitamins-Minerals (MULTIVITAMIN WITH MINERALS) tablet Take 1 tablet by mouth daily.    [provider]  Norgestimate-Ethinyl Estradiol Triphasic 0.18/0.215/0.25 MG-25 MCG tab Take 1 tablet by mouth daily. 01/28/17   [provider]  ondansetron (ZOFRAN) 4 MG tablet Take 1 tablet (4 mg total) by mouth daily as needed for nausea or vomiting. 02/27/17 02/27/18  Darel Hong, MD  oxyCODONE-acetaminophen (ROXICET) 5-325 MG tablet Take 1-2 tablets by mouth every 6 (six) hours as needed for severe pain.  Patient not taking: Reported on 05/26/2017 03/26/17   Lisa Roca, MD  sertraline (ZOLOFT) 25 MG tablet Take 25 mg by mouth every morning.  08/19/16   [provider]    Allergies Patient has no known allergies.  No family history on file.  Social History Social History   Tobacco Use  . Smoking status: Never Smoker  . Smokeless tobacco: Never Used  Substance Use Topics  . Alcohol use: No  . Drug use: No     Review of Systems Constitutional: No fever/chills Eyes: No visual changes. ENT: No sore throat. Cardiovascular: Denies chest pain. Respiratory: Denies shortness of breath. Gastrointestinal: No abdominal pain.  No nausea, no vomiting.  No diarrhea.   Genitourinary: Positive for dysuria. Musculoskeletal: Negative for back pain. Skin: Negative for rash. Neurological: Positive for headache.  ____________________________________________   PHYSICAL EXAM:  VITAL SIGNS: ED Triage Vitals  Enc Vitals Group     BP 07/01/17 1832 118/83     Pulse Rate 07/01/17 1832 69     Resp 07/01/17 1832 16     Temp 07/01/17 1832 98.2 F (36.8 C)     Temp Source 07/01/17 1832 Oral     SpO2 07/01/17 1832 99 %     Weight 07/01/17 1830 287 lb (130.2 kg)     Height 07/01/17 1830 5\' 7"  (1.702 m)     Head Circumference --      Peak Flow --      Pain Score 07/01/17 1830 9   Constitutional: Alert and oriented. Well appearing and in no distress. Eyes: Conjunctivae are normal.  ENT   Head: Normocephalic and atraumatic.   Nose: No congestion/rhinnorhea.   Mouth/Throat: Mucous membranes are moist.   Neck: No stridor. Hematological/Lymphatic/Immunilogical: No cervical lymphadenopathy. Cardiovascular: Normal rate, regular rhythm.  No murmurs, rubs, or gallops.  Respiratory: Normal respiratory effort without tachypnea nor retractions. Breath sounds are clear and equal bilaterally. No wheezes/rales/rhonchi. Gastrointestinal: Soft and non tender. No rebound. No guarding.  Genitourinary: Deferred Musculoskeletal: Normal range of motion in all extremities. No lower extremity edema. Neurologic:  Normal speech and language. No gross focal neurologic deficits are appreciated.  Skin:  Skin is warm, dry and intact. No rash noted. Psychiatric: Mood and affect are normal. Speech and behavior are normal. Patient exhibits appropriate insight and judgment.  ____________________________________________     LABS (pertinent positives/negatives)  U preg negative  ____________________________________________   EKG  None  ____________________________________________    RADIOLOGY  None  ____________________________________________   PROCEDURES  Procedures  ____________________________________________   INITIAL IMPRESSION / ASSESSMENT AND PLAN / ED COURSE  Pertinent labs & imaging results that were available during my care of the patient were reviewed by me and considered in my medical decision making (see chart for details).  Patient presented to the emergency department today with primary concern for headache.  Is looking on the left side where she had a surgery performed 2 months ago.  She is not followed up recent with her neurosurgeon however her neurosurgeon is aware of headaches after surgery.  Patient felt better after IV fluids and medication.  She felt comfortable going home.  Will provide prescription for Compazine.  Discussed this with patient.  Discussed importance of following up. _____________________________________   FINAL CLINICAL IMPRESSION(S) / ED DIAGNOSES  Final diagnoses:  Bad headache     Note: This dictation was prepared with Dragon dictation. Any transcriptional errors that result from this process are unintentional     Nance Pear, MD 07/01/17  2223  

## 2017-08-17 ENCOUNTER — Ambulatory Visit: Payer: Medicaid Other | Admitting: Certified Registered Nurse Anesthetist

## 2017-08-17 ENCOUNTER — Ambulatory Visit
Admission: RE | Admit: 2017-08-17 | Discharge: 2017-08-17 | Disposition: A | Discharge status: Home / Self Care | Payer: Medicaid Other | Source: Ambulatory Visit | Attending: Unknown Physician Specialty | Admitting: Unknown Physician Specialty

## 2017-08-17 ENCOUNTER — Encounter
Admission: RE | Disposition: A | Discharge status: Home / Self Care | Payer: Self-pay | Source: Ambulatory Visit | Attending: Unknown Physician Specialty

## 2017-08-17 ENCOUNTER — Other Ambulatory Visit: Payer: Self-pay

## 2017-08-17 DIAGNOSIS — Z6841 Body Mass Index (BMI) 40.0 and over, adult: Secondary | ICD-10-CM | POA: Diagnosis not present

## 2017-08-17 DIAGNOSIS — Z7951 Long term (current) use of inhaled steroids: Secondary | ICD-10-CM | POA: Diagnosis not present

## 2017-08-17 DIAGNOSIS — K219 Gastro-esophageal reflux disease without esophagitis: Secondary | ICD-10-CM | POA: Diagnosis not present

## 2017-08-17 DIAGNOSIS — F329 Major depressive disorder, single episode, unspecified: Secondary | ICD-10-CM | POA: Insufficient documentation

## 2017-08-17 DIAGNOSIS — K2950 Unspecified chronic gastritis without bleeding: Secondary | ICD-10-CM | POA: Insufficient documentation

## 2017-08-17 DIAGNOSIS — Z793 Long term (current) use of hormonal contraceptives: Secondary | ICD-10-CM | POA: Diagnosis not present

## 2017-08-17 DIAGNOSIS — Z79899 Other long term (current) drug therapy: Secondary | ICD-10-CM | POA: Insufficient documentation

## 2017-08-17 DIAGNOSIS — R1013 Epigastric pain: Secondary | ICD-10-CM | POA: Diagnosis present

## 2017-08-17 HISTORY — DX: Diarrhea, unspecified: R19.7

## 2017-08-17 HISTORY — DX: Nausea with vomiting, unspecified: R11.2

## 2017-08-17 HISTORY — DX: Other specified postprocedural states: Z98.890

## 2017-08-17 HISTORY — PX: ESOPHAGOGASTRODUODENOSCOPY (EGD) WITH PROPOFOL: SHX5813

## 2017-08-17 HISTORY — DX: Unspecified abdominal pain: R10.9

## 2017-08-17 HISTORY — DX: Cyclical vomiting syndrome unrelated to migraine: R11.15

## 2017-08-17 LAB — POCT PREGNANCY, URINE: Preg Test, Ur: NEGATIVE

## 2017-08-17 SURGERY — ESOPHAGOGASTRODUODENOSCOPY (EGD) WITH PROPOFOL
Anesthesia: General

## 2017-08-17 MED ORDER — PROPOFOL 500 MG/50ML IV EMUL
INTRAVENOUS | Status: DC | PRN
  Administered 2017-08-17: 140 ug/kg/min via INTRAVENOUS

## 2017-08-17 MED ORDER — LIDOCAINE HCL (PF) 2 % IJ SOLN
INTRAMUSCULAR | Status: AC
  Filled 2017-08-17: qty 10

## 2017-08-17 MED ORDER — FENTANYL CITRATE (PF) 100 MCG/2ML IJ SOLN
INTRAMUSCULAR | Status: AC
  Filled 2017-08-17: qty 2

## 2017-08-17 MED ORDER — SODIUM CHLORIDE 0.9 % IV SOLN: INTRAVENOUS | Status: DC

## 2017-08-17 MED ORDER — MIDAZOLAM HCL 2 MG/2ML IJ SOLN
INTRAMUSCULAR | Status: AC
  Filled 2017-08-17: qty 2

## 2017-08-17 MED ORDER — PROPOFOL 500 MG/50ML IV EMUL
INTRAVENOUS | Status: AC
  Filled 2017-08-17: qty 50

## 2017-08-17 MED ORDER — PROPOFOL 10 MG/ML IV BOLUS
INTRAVENOUS | Status: DC | PRN
  Administered 2017-08-17: 26 mg via INTRAVENOUS
  Administered 2017-08-17: 80 mg via INTRAVENOUS

## 2017-08-17 MED ORDER — SODIUM CHLORIDE 0.9 % IV SOLN
INTRAVENOUS | Status: DC
  Administered 2017-08-17: 13:00:00 via INTRAVENOUS

## 2017-08-17 MED ORDER — MIDAZOLAM HCL 2 MG/2ML IJ SOLN
INTRAMUSCULAR | Status: DC | PRN
  Administered 2017-08-17: 2 mg via INTRAVENOUS

## 2017-08-17 MED ORDER — FENTANYL CITRATE (PF) 100 MCG/2ML IJ SOLN
INTRAMUSCULAR | Status: DC | PRN
  Administered 2017-08-17 (×2): 50 ug via INTRAVENOUS

## 2017-08-17 MED ORDER — LIDOCAINE HCL (CARDIAC) 20 MG/ML IV SOLN
INTRAVENOUS | Status: DC | PRN
  Administered 2017-08-17: 50 mg via INTRAVENOUS

## 2017-08-17 NOTE — Anesthesia Postprocedure Evaluation (Signed)
Anesthesia Post Note  Patient: Kathryn Valencia  Procedure(s) Performed: ESOPHAGOGASTRODUODENOSCOPY (EGD) WITH PROPOFOL (N/A )  Patient location during evaluation: PACU Anesthesia Type: General Level of consciousness: awake and alert and oriented Pain management: pain level controlled Vital Signs Assessment: post-procedure vital signs reviewed and stable Respiratory status: spontaneous breathing Cardiovascular status: blood pressure returned to baseline Anesthetic complications: no     Last Vitals:  Vitals:   08/17/17 1403 08/17/17 1413  BP: (!) 105/48 (!) 93/48  Pulse: 95 64  Resp: 16 17  Temp: 36.7 C   SpO2: 99% 100%    Last Pain:  Vitals:   08/17/17 1403  TempSrc: Tympanic  PainSc: Asleep                 Ron Junco

## 2017-08-17 NOTE — Anesthesia Post-op Follow-up Note (Signed)
Anesthesia QCDR form completed.        

## 2017-08-17 NOTE — Anesthesia Preprocedure Evaluation (Signed)
Anesthesia Evaluation  Patient identified by MRN, date of birth, ID band Patient awake    Reviewed: Allergy & Precautions, NPO status , Patient's Chart, lab work & pertinent test results, reviewed documented beta blocker date and time   History of Anesthesia Complications (+) PONV and history of anesthetic complications  Airway Mallampati: III  TM Distance: >3 FB     Dental  (+) Chipped   Pulmonary           Cardiovascular      Neuro/Psych  Headaches, PSYCHIATRIC DISORDERS Depression  Neuromuscular disease    GI/Hepatic GERD  Controlled,  Endo/Other  Morbid obesity  Renal/GU      Musculoskeletal   Abdominal   Peds  Hematology   Anesthesia Other Findings   Reproductive/Obstetrics                             Anesthesia Physical  Anesthesia Plan  ASA: III  Anesthesia Plan: General   Post-op Pain Management:    Induction: Intravenous  PONV Risk Score and Plan: TIVA  Airway Management Planned: Nasal Cannula  Additional Equipment:   Intra-op Plan:   Post-operative Plan:   Informed Consent: I have reviewed the patients History and Physical, chart, labs and discussed the procedure including the risks, benefits and alternatives for the proposed anesthesia with the patient or authorized representative who has indicated his/her understanding and acceptance.     Plan Discussed with: CRNA  Anesthesia Plan Comments:         Anesthesia Quick Evaluation

## 2017-08-17 NOTE — H&P (Signed)
Primary Care Physician:  [facility] Primary Gastroenterologist:  Dr. Vira Agar  Pre-Procedure History & Physical: HPI:  Kathryn Valencia is a 32 y.o. female is here for an endoscopy.  This is for epigastric abd pain after meals.   Past Medical History:  Diagnosis Date  . Abdominal pain   . Acoustic neuroma ([place]) 01/2015  . Acoustic neuroma ([place])   . Chronic headaches   . Depression   . Diarrhea, unspecified   . GERD (gastroesophageal reflux disease)   . Morbid obesity ([place])   . Non-intractable cyclical vomiting with nausea   . PONV (postoperative nausea and vomiting)   . UTI (urinary tract infection) 03/20/2017   DX IN ED ON 03-20-17 AND IS CURRENTLY TAKING BACTRIM X 3 DAYS BID    Past Surgical History:  Procedure Laterality Date  . CHOLECYSTECTOMY N/A 03/25/2017   Procedure: LAPAROSCOPIC CHOLECYSTECTOMY;  Surgeon: Herbert Pun, MD;  Location: [facility] ORS;  Service: General;  Laterality: N/A;  . 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    Prior to Admission medications   Medication Sig Start Date End Date Taking? Authorizing Provider  acetaminophen (TYLENOL) 500 MG tablet Take 1,000 mg by mouth every 6 (six) hours as needed for mild pain or moderate pain.   Yes [provider]  butalbital-acetaminophen-caffeine (FIORICET, ESGIC) [phone] MG tablet Take 1-2 tablets by mouth every 6 (six) hours as needed for headache. 05/26/17 05/26/18 Yes Hinda Kehr, MD  cetirizine (ZYRTEC) 5 MG tablet Take 5 mg by mouth daily as needed for allergies.   Yes [provider]  esomeprazole (NEXIUM) 20 MG capsule Take 20 mg by mouth every morning. Continuous    Yes [provider]  fluticasone (FLONASE) 50 MCG/ACT nasal spray Place 2 sprays into both nostrils daily.   Yes [provider]  Norgestimate-Ethinyl Estradiol Triphasic 0.18/0.215/0.25 MG-25 MCG tab Take 1 tablet by mouth daily. 01/28/17  Yes [provider]  sertraline (ZOLOFT) 25 MG tablet Take 25 mg by mouth every morning.  08/19/16  Yes [provider]  cephALEXin (KEFLEX) 500 MG capsule Take 1 capsule (500 mg total) by mouth 2 (two) times daily. Patient not taking: Reported on 08/17/2017 05/26/17   Hinda Kehr, MD  famotidine (PEPCID) 20 MG tablet Take 1 tablet (20 mg total) by mouth 2 (two) times daily. Patient not taking: Reported on 03/17/2017 01/04/17 01/04/18  Rudene Re, MD  Multiple Vitamins-Minerals (MULTIVITAMIN WITH MINERALS) tablet Take 1 tablet by mouth daily.    [provider]  ondansetron (ZOFRAN) 4 MG tablet Take 1 tablet (4 mg total) by mouth daily as needed for nausea or vomiting. Patient not taking: Reported on 08/17/2017 02/27/17 02/27/18  Darel Hong, MD  oxyCODONE-acetaminophen (ROXICET) 5-325 MG tablet Take 1-2 tablets by mouth every 6 (six) hours as needed for severe pain. Patient not taking: Reported on 05/26/2017 03/26/17   Lisa Roca, MD  prochlorperazine (COMPAZINE) 10 MG tablet Take 1 tablet (10 mg total) by mouth every 6 (six) hours as needed for nausea or vomiting. Patient not taking: Reported on 08/17/2017 07/01/17   Nance Pear, MD    Allergies as of 06/11/2017  . (No Known Allergies)    History reviewed. No pertinent family history.  Social History   Socioeconomic History  . Marital status: Married    Spouse name: Not on file  . Number of children: Not on file  . Years of education: Not on file  . Highest  education level: Not on file  Social Needs  . Financial resource strain: Not on file  . Food insecurity - worry: Not on file  . Food insecurity - inability: Not on file  . Transportation needs - medical: Not on file  . Transportation needs - non-medical: Not on file  Occupational History  . Not on file  Tobacco Use  . Smoking status: Never Smoker  . Smokeless tobacco: Never Used  Substance and Sexual Activity  . Alcohol use: No  . Drug use: No   . Sexual activity: Not on file  Other Topics Concern  . Not on file  Social History Narrative  . Not on file    Review of Systems: See HPI, otherwise negative ROS  Physical Exam: BP (!) 122/57   Pulse 69   Temp 98.6 F (37 C) (Tympanic)   Resp 18   Ht 5\' 8"  (1.727 m)   Wt 131.1 kg (289 lb)   LMP 07/24/2017   SpO2 100%   BMI 43.94 kg/m  General:   Alert,  pleasant and cooperative in NAD Head:  Normocephalic and atraumatic. Neck:  Supple; no masses or thyromegaly. Lungs:  Clear throughout to auscultation.    Heart:  Regular rate and rhythm. Abdomen:  Soft, nontender and nondistended. Normal bowel sounds, without guarding, and without rebound.   Neurologic:  Alert and  oriented x4;  grossly normal neurologically.  Impression/Plan: Kathryn Valencia is here for an endoscopy to be performed for abdominal pain, esp after eating.  Risks, benefits, limitations, and alternatives regarding  endoscopy have been reviewed with the patient.  Questions have been answered.  All parties agreeable.   Gaylyn Cheers, MD  08/17/2017, 1:49 PM

## 2017-08-17 NOTE — Transfer of Care (Signed)
Immediate Anesthesia Transfer of Care Note  Patient: Kathryn Valencia  Procedure(s) Performed: ESOPHAGOGASTRODUODENOSCOPY (EGD) WITH PROPOFOL (N/A )  Patient Location: PACU and Endoscopy Unit  Anesthesia Type:General  Level of Consciousness: drowsy  Airway & Oxygen Therapy: Patient Spontanous Breathing and Patient connected to nasal cannula oxygen  Post-op Assessment: Report given to RN and Post -op Vital signs reviewed and stable  Post vital signs: Reviewed and stable  Last Vitals:  Vitals:   08/17/17 1237 08/17/17 1403  BP: (!) 122/57   Pulse: 69 (P) 95  Resp: 18 (P) 16  Temp: 37 C (P) 36.7 C  SpO2: 100% (P) 99%    Last Pain:  Vitals:   08/17/17 1403  TempSrc: (P) Tympanic         Complications: No apparent anesthesia complications

## 2017-08-17 NOTE — Op Note (Signed)
[facility] Gastroenterology Patient Name: Kathryn Valencia Procedure Date: 08/17/2017 1:38 PM MRN: [id number] Account #: [id number] Date of Birth: 06/18/1985 Admit Type: Outpatient Age: 32 Room: [facility] ENDO ROOM 3 Gender: Female Note Status: Finalized Procedure:            Upper GI endoscopy Indications:          Epigastric abdominal pain Providers:            Manya Silvas, MD Medicines:            Propofol per Anesthesia Complications:        No immediate complications. Procedure:            Pre-Anesthesia Assessment:                       - After reviewing the risks and benefits, the patient                        was deemed in satisfactory condition to undergo the                        procedure.                       After obtaining informed consent, the endoscope was                        passed under direct vision. Throughout the procedure,                        the patient's blood pressure, pulse, and oxygen                        saturations were monitored continuously. The Endoscope                        was introduced through the mouth, and advanced to the                        second part of duodenum. The upper GI endoscopy was                        accomplished without difficulty. The patient tolerated                        the procedure well. Findings:      The examined esophagus was normal. GEJ 38cm.      Patchy mildly erythematous mucosa without bleeding was found in the       gastric body. Biopsies were taken with a cold forceps for histology.       Biopsies were taken with a cold forceps for Helicobacter pylori testing.      The gastric antrum was normal. Biopsies were taken with a cold forceps       for histology. Biopsies were taken with a cold forceps for Helicobacter       pylori testing.      The examined duodenum was normal. Impression:           - Normal esophagus.                       - Erythematous mucosa in the  gastric  body. Biopsied.                       - Normal antrum. Biopsied.                       - Normal examined duodenum. Recommendation:       - Await pathology results. Take medicine. Manya Silvas, MD 08/17/2017 2:03:09 PM This report has been signed electronically. Number of Addenda: 0 Note Initiated On: 08/17/2017 1:38 PM      [facility]

## 2017-08-18 ENCOUNTER — Encounter: Payer: Self-pay | Admitting: Unknown Physician Specialty

## 2017-08-20 LAB — SURGICAL PATHOLOGY

## 2017-11-27 ENCOUNTER — Emergency Department: Payer: Medicaid Other

## 2017-11-27 ENCOUNTER — Other Ambulatory Visit: Payer: Self-pay

## 2017-11-27 DIAGNOSIS — R0789 Other chest pain: Secondary | ICD-10-CM | POA: Insufficient documentation

## 2017-11-27 DIAGNOSIS — R110 Nausea: Secondary | ICD-10-CM | POA: Insufficient documentation

## 2017-11-27 DIAGNOSIS — Z5321 Procedure and treatment not carried out due to patient leaving prior to being seen by health care provider: Secondary | ICD-10-CM | POA: Diagnosis not present

## 2017-11-27 DIAGNOSIS — R0602 Shortness of breath: Secondary | ICD-10-CM | POA: Diagnosis not present

## 2017-11-27 LAB — CBC
HEMATOCRIT: 39.4 % (ref 35.0–47.0)
HEMOGLOBIN: 13.6 g/dL (ref 12.0–16.0)
MCH: 30.6 pg (ref 26.0–34.0)
MCHC: 34.4 g/dL (ref 32.0–36.0)
MCV: 88.9 fL (ref 80.0–100.0)
PLATELETS: 236 10*3/uL (ref 150–440)
RBC: 4.44 MIL/uL (ref 3.80–5.20)
RDW: 13.4 % (ref 11.5–14.5)
WBC: 7 10*3/uL (ref 3.6–11.0)

## 2017-11-27 NOTE — ED Triage Notes (Signed)
Patient reports symptoms began approximately 1 hour prior to arrival.  Reports left sided chest pain that radiates into left shoulder with shortness of breath and nausea.

## 2017-11-27 NOTE — ED Notes (Signed)
Patient transported to X-ray 

## 2017-11-28 ENCOUNTER — Emergency Department
Admission: EM | Admit: 2017-11-28 | Discharge: 2017-11-28 | Disposition: A | Discharge status: Home / Self Care | Payer: Medicaid Other | Attending: Emergency Medicine | Admitting: Emergency Medicine

## 2017-11-28 LAB — BASIC METABOLIC PANEL
ANION GAP: 10 (ref 5–15)
BUN: 8 mg/dL (ref 6–20)
CALCIUM: 9.3 mg/dL (ref 8.9–10.3)
CHLORIDE: 108 mmol/L (ref 101–111)
CO2: 21 mmol/L — AB (ref 22–32)
CREATININE: 0.76 mg/dL (ref 0.44–1.00)
GFR calc Af Amer: >60 mL/min (ref >60)
GFR calc non Af Amer: >60 mL/min (ref >60)
GLUCOSE: 92 mg/dL (ref 65–99)
Potassium: 3.4 mmol/L — ABNORMAL LOW (ref 3.5–5.1)
Sodium: 139 mmol/L (ref 135–145)

## 2017-11-28 LAB — TROPONIN I: Troponin I: <0.03 ng/mL (ref <0.03)

## 2017-11-28 NOTE — ED Notes (Signed)
Patient called for a room with no answer. 

## 2017-11-28 NOTE — ED Notes (Signed)
Patient ambulating in lobby in no acute distress.  

## 2018-05-14 ENCOUNTER — Other Ambulatory Visit: Payer: Self-pay | Admitting: Family Medicine

## 2018-05-14 DIAGNOSIS — N644 Mastodynia: Secondary | ICD-10-CM

## 2018-06-28 ENCOUNTER — Encounter (INDEPENDENT_AMBULATORY_CARE_PROVIDER_SITE_OTHER): Payer: Self-pay

## 2018-06-28 ENCOUNTER — Ambulatory Visit: Payer: Medicaid Other | Attending: Oncology

## 2018-06-28 VITALS — BP 116/77 | HR 59 | Temp 97.6°F | Ht 68.5 in | Wt 282.3 lb

## 2018-06-28 DIAGNOSIS — N644 Mastodynia: Secondary | ICD-10-CM

## 2018-06-28 NOTE — Progress Notes (Signed)
  Subjective:     Patient ID: Kathryn Valencia, female   DOB: 04-17-1986, 33 y.o.   MRN: [id number]  HPI   Review of Systems     Objective:   Physical Exam Chest:     Breasts:        Right: No swelling, bleeding, inverted nipple, mass, nipple discharge, skin change or tenderness.        Left: Tenderness present. No swelling, bleeding, inverted nipple, mass, nipple discharge or skin change.       Comments: Left breast intermittent tenderness x 1 month .  No mass associated .  No tenderness on palpation       Assessment:     33 year old patient presents for [place] clinic visit. Complains of left breast intermittent tenderness x 1 month .  Denies associated mass. Pain releived with Tylenol.  Reports she does drink a lot of caffeinated soda.   Patient screened, and meets BCCCP eligibility.  Patient does not have insurance, Medicare or Medicaid.  Handout given on Affordable Care Act.  Instructed patient on breast self awareness using teach back method.  Clinical breast exam unremarkable.  No mass or lump palpated.  No tenderness on palpation.      Plan:     Patient instructed to wean caffeine intake rather than stop suddenly to prevent headache.  Continue Tylenol if relief of tenderness obtained.  Patient is scheduled to return on 09/29/2018 at 12:30 for follow-up exam.  Instructed to call to be seen in clinic if symptoms worsen.

## 2018-08-11 NOTE — Progress Notes (Signed)
Patient to return 09/29/2018 at 12:30 for repeat clinical breast exam to evaluate left breast pain.  Copy to [facility].

## 2018-09-29 ENCOUNTER — Ambulatory Visit: Payer: Medicaid Other | Attending: Oncology

## 2018-11-22 ENCOUNTER — Encounter: Payer: Self-pay | Admitting: Emergency Medicine

## 2018-11-22 ENCOUNTER — Other Ambulatory Visit: Payer: Self-pay

## 2018-11-22 ENCOUNTER — Emergency Department
Admission: EM | Admit: 2018-11-22 | Discharge: 2018-11-22 | Disposition: A | Discharge status: Home / Self Care | Payer: Medicaid Other | Attending: Student in an Organized Health Care Education/Training Program | Admitting: Student in an Organized Health Care Education/Training Program

## 2018-11-22 ENCOUNTER — Emergency Department: Payer: Medicaid Other

## 2018-11-22 DIAGNOSIS — M79642 Pain in left hand: Secondary | ICD-10-CM | POA: Insufficient documentation

## 2018-11-22 DIAGNOSIS — R202 Paresthesia of skin: Secondary | ICD-10-CM | POA: Diagnosis not present

## 2018-11-22 DIAGNOSIS — Z79899 Other long term (current) drug therapy: Secondary | ICD-10-CM | POA: Diagnosis not present

## 2018-11-22 DIAGNOSIS — M79641 Pain in right hand: Secondary | ICD-10-CM | POA: Diagnosis present

## 2018-11-22 MED ORDER — CYCLOBENZAPRINE HCL 5 MG PO TABS: ORAL_TABLET | ORAL | 0 refills | Status: DC

## 2018-11-22 MED ORDER — PREDNISONE 50 MG PO TABS: ORAL_TABLET | ORAL | 0 refills | Status: DC

## 2018-11-22 MED ORDER — KETOROLAC TROMETHAMINE 30 MG/ML IJ SOLN
30.0000 mg | Freq: Once | INTRAMUSCULAR | Status: AC
  Administered 2018-11-22: 14:00:00 30 mg via INTRAMUSCULAR
  Filled 2018-11-22: qty 1

## 2018-11-22 MED ORDER — METHYLPREDNISOLONE SODIUM SUCC 125 MG IJ SOLR
125.0000 mg | Freq: Once | INTRAMUSCULAR | Status: AC
  Administered 2018-11-22: 125 mg via INTRAMUSCULAR
  Filled 2018-11-22: qty 2

## 2018-11-22 NOTE — ED Notes (Signed)
See triage note  Presents with tingling and numbness to both arms  States it is worse at night and to hands  Was placed on naprosyn w/o relief

## 2018-11-22 NOTE — Discharge Instructions (Signed)
I have given you some steroids to help decrease the inflammation in your nerves.  Please take Flexeril to help relax your muscles.  You can continue Norco your naproxen.  Your nerve pain may be coming from your neck or could be carpal tunnel.  I have given you referrals to both the hand surgeon and the neurosurgeon.  Please call primary care for an appointment this week.

## 2018-11-22 NOTE — ED Provider Notes (Signed)
[facility] Emergency Department Provider Note  ____________________________________________  Time seen: Approximately 12:30 PM  I have reviewed the triage vital signs and the nursing notes.   HISTORY  Chief Complaint Numbness    HPI Kathryn Valencia is a 33 y.o. female that presents to the emergency department for evaluation of bilateral hand pain, numbness, tingling for 2 weeks.  Occasionally she will feel shooting pains up both of her arms. She had some pain to her right upper arm earlier. Pain is worse at night.  Hands "burn." Patient saw primary care and was prescribed naproxen for carpal tunnel.  She has been wearing sleeves on her hands at night without relief.  Patient took naproxen last night with no relief.  Patient works at [employer] as a Scientist, water quality.  She also spends a moderate amount of time on her computer.  No trauma.  No neck pain.   Past Medical History:  Diagnosis Date  . Abdominal pain   . 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 Acoustic neuroma ([place])   . Chronic headaches   . Depression   . Diarrhea, unspecified   . GERD (gastroesophageal reflux disease)   . Morbid obesity ([place])   . Non-intractable cyclical vomiting with nausea   . PONV (postoperative nausea and vomiting)   . UTI (urinary tract infection) 03/20/2017   DX IN ED ON 03-20-17 AND IS CURRENTLY TAKING BACTRIM X 3 DAYS BID    There are no active problems to display for this patient.   Past Surgical History:  Procedure Laterality Date  . CHOLECYSTECTOMY N/A 03/25/2017   Procedure: LAPAROSCOPIC CHOLECYSTECTOMY;  Surgeon: Herbert Pun, MD;  Location: [facility] ORS;  Service: General;  Laterality: N/A;  . 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  . ESOPHAGOGASTRODUODENOSCOPY (EGD) WITH PROPOFOL N/A 08/17/2017   Procedure: ESOPHAGOGASTRODUODENOSCOPY (EGD) WITH PROPOFOL;  Surgeon: Manya Silvas, MD;  Location: [facility] ENDOSCOPY;  Service: Endoscopy;  Laterality: N/A;     Prior to Admission medications   Medication Sig Start Date End Date Taking? Authorizing Provider  acetaminophen (TYLENOL) 500 MG tablet Take 1,000 mg by mouth every 6 (six) hours as needed for mild pain or moderate pain.    [provider]  cetirizine (ZYRTEC) 5 MG tablet Take 5 mg by mouth daily as needed for allergies.    [provider]  cyclobenzaprine (FLEXERIL) 5 MG tablet Take 1-2 tablets 3 times daily as needed 11/22/18   Laban Emperor, PA-C  esomeprazole (NEXIUM) 20 MG capsule Take 20 mg by mouth every morning. Continuous     [provider]  fluticasone (FLONASE) 50 MCG/ACT nasal spray Place 2 sprays into both nostrils daily.    [provider]  Multiple Vitamins-Minerals (MULTIVITAMIN WITH MINERALS) tablet Take 1 tablet by mouth daily.    [provider]  Norgestimate-Ethinyl Estradiol Triphasic 0.18/0.215/0.25 MG-25 MCG tab Take 1 tablet by mouth daily. 01/28/17   [provider]  predniSONE (DELTASONE) 50 MG tablet Take 1 tablet per day 11/22/18   Laban Emperor, PA-C  sertraline (ZOLOFT) 25 MG tablet Take 25 mg by mouth every morning.  08/19/16   [provider]    Allergies Patient has no known allergies.  No family history on file.  Social History Social History   Tobacco Use  . Smoking status: Never Smoker  . Smokeless tobacco: Never Used  Substance Use Topics  . Alcohol use: No  . Drug use: No     Review of Systems  Constitutional: No fever/chills Cardiovascular: No chest pain. Respiratory:  No SOB. Gastrointestinal: No nausea, no vomiting.  Musculoskeletal: Positive for bilateral arm and hand pain. Skin: Negative for rash, abrasions, lacerations, ecchymosis. Neurological: Negative for headaches.  Positive for numbness or tingling   ____________________________________________   PHYSICAL EXAM:  VITAL SIGNS: ED Triage Vitals  Enc Vitals Group     BP 11/22/18 1207 111/66     Pulse Rate  11/22/18 1207 (!) 57     Resp 11/22/18 1207 20     Temp 11/22/18 1207 98.2 F (36.8 C)     Temp Source 11/22/18 1207 Oral     SpO2 11/22/18 1207 97 %     Weight 11/22/18 1159 270 lb (122.5 kg)     Height 11/22/18 1159 5\' 7"  (1.702 m)     Head Circumference --      Peak Flow --      Pain Score 11/22/18 1159 9     Pain Loc --      Pain Edu? --      Excl. in [place]? --      Constitutional: Alert and oriented. Well appearing and in no acute distress. Eyes: Conjunctivae are normal. PERRL. EOMI. Head: Atraumatic. ENT:      Ears:      Nose: No congestion/rhinnorhea.      Mouth/Throat: Mucous membranes are moist.  Neck: No stridor.  No cervical spine tenderness. Cardiovascular: Normal rate, regular rhythm.  Good peripheral circulation. Respiratory: Normal respiratory effort without tachypnea or retractions. Lungs CTAB. Good air entry to the bases with no decreased or absent breath sounds. Musculoskeletal: Full range of motion to all extremities. No gross deformities appreciated.  Strength and sensation equal in upper extremities bilaterally.  Grip strength in tact. Positive Phalens test.  Negative Tinel's. Neurologic:  Normal speech and language. No gross focal neurologic deficits are appreciated.  Skin:  Skin is warm, dry and intact. No rash noted. Psychiatric: Mood and affect are normal. Speech and behavior are normal. Patient exhibits appropriate insight and judgement.   ____________________________________________   LABS (all labs ordered are listed, but only abnormal results are displayed)  Labs Reviewed - No data to display ____________________________________________  EKG   ____________________________________________  RADIOLOGY Robinette Haines, personally viewed and evaluated these images (plain radiographs) as part of my medical decision making, as well as reviewing the written report by the radiologist.  Dg Cervical Spine 2-3 Views  Result Date: 11/22/2018 CLINICAL  DATA:  Pain and numbness of arms EXAM: CERVICAL SPINE - 2-3 VIEW COMPARISON:  None. FINDINGS: Mild curvature convex to left. Very rudimentary cervical ribs. No disc space narrowing. No evidence of degenerative facet arthropathy. No focal bone lesion. IMPRESSION: Mild curvature convex to the left. Diminutive cervical ribs. No superimposed degenerative changes evident. Electronically Signed   By: Nelson Chimes M.D.   On: 11/22/2018 13:21    ____________________________________________    PROCEDURES  Procedure(s) performed:    Procedures    Medications  ketorolac (TORADOL) 30 MG/ML injection 30 mg (30 mg Intramuscular Given 11/22/18 1332)  methylPREDNISolone sodium succinate (SOLU-MEDROL) 125 mg/2 mL injection 125 mg (125 mg Intramuscular Given 11/22/18 1404)     ____________________________________________   INITIAL IMPRESSION / ASSESSMENT AND PLAN / ED COURSE  Pertinent labs & imaging results that were available during my care of the patient were reviewed by me and considered in my medical decision making (see chart for details).  Review of the [place] CSRS was performed in accordance of the  N[facility] prior to dispensing any controlled drugs.   Patient's presented to the emergency department for evaluation of bilateral arm pain, tingling, numbness.  Symptoms are most consistent from carpal tunnel but patient has had some upper arm shooting pains so may be cervical radiculopathy as well. Xray consistent with chronic changes.  Patient was given a shot of Toradol and IM Solu-Medrol in the emergency department.  Patient will be discharged home with prescriptions for prednisone and Flexeril. Patient is to follow up with primary care, neurosurgery, and hand ortho directed. Patient is given ED precautions to return to the ED for any worsening or new symptoms.  Avionna Bower was evaluated in Emergency Department on 11/22/2018 for the symptoms described in the history of present illness. She was  evaluated in the context of the global COVID-19 pandemic, which necessitated consideration that the patient might be at risk for infection with the SARS-CoV-2 virus that causes COVID-19. Institutional protocols and algorithms that pertain to the evaluation of patients at risk for COVID-19 are in a state of rapid change based on information released by regulatory bodies including the CDC and federal and state organizations. These policies and algorithms were followed during the patient's care in the ED.     ____________________________________________  FINAL CLINICAL IMPRESSION(S) / ED DIAGNOSES  Final diagnoses:  Paresthesias      NEW MEDICATIONS STARTED DURING THIS VISIT:  ED Discharge Orders         Ordered    predniSONE (DELTASONE) 50 MG tablet     11/22/18 1359    cyclobenzaprine (FLEXERIL) 5 MG tablet     11/22/18 1359              This chart was dictated using voice recognition software/Dragon. Despite best efforts to proofread, errors can occur which can change the meaning. Any change was purely unintentional.    Laban Emperor, PA-C 11/22/18 1936    Merlyn Lot, MD 11/23/18 [phone]

## 2018-11-22 NOTE — ED Triage Notes (Signed)
Pt reports pain in both arms from shoulders down to hands. Pt states has been going on for awhile. Pt states has carpal tunnel as well and has seen her MD about it and they gave her naproxen but it does not help.

## 2018-11-22 NOTE — ED Triage Notes (Signed)
Pt denies all other sx;s or injuries.

## 2019-07-12 IMAGING — US US ABDOMEN LIMITED
1 series · 14 of 25 positions shown · non-contrast
Comparison: 01/04/2017

CLINICAL DATA: Right upper quadrant pain 1 day.

EXAM:
ULTRASOUND ABDOMEN LIMITED RIGHT UPPER QUADRANT

[Series 1: us abdomen limited · 0.28mm/px · 14 of 51 slices shown]
[im 1/51]
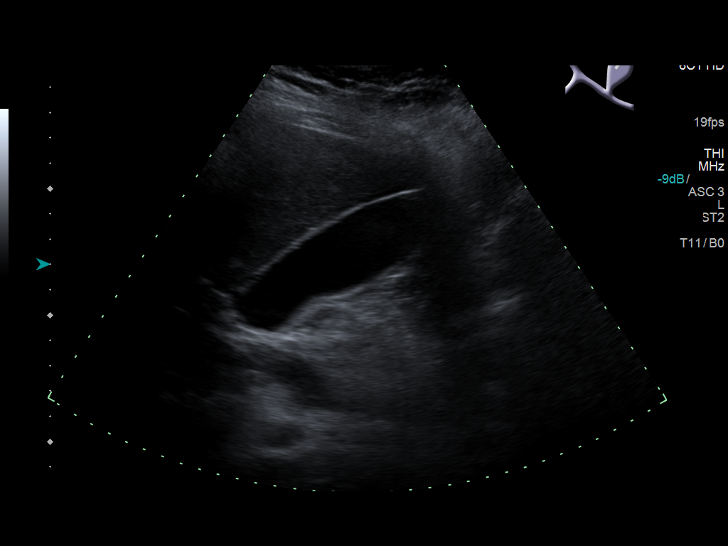
[im 5/51]
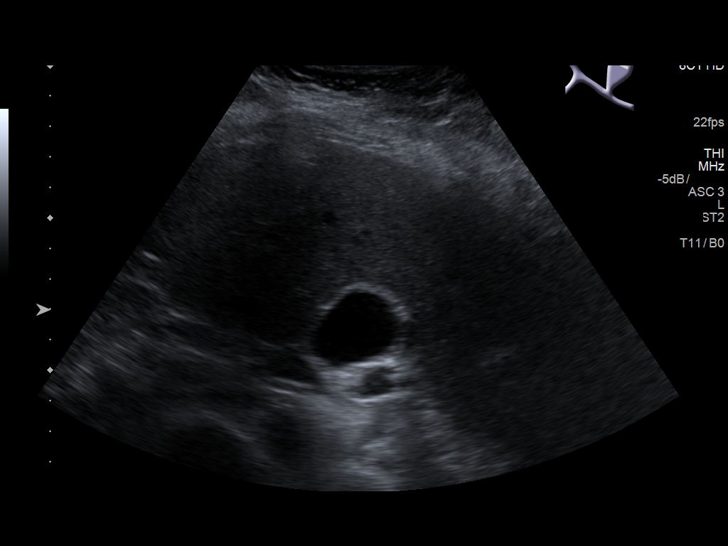
[im 9/51]
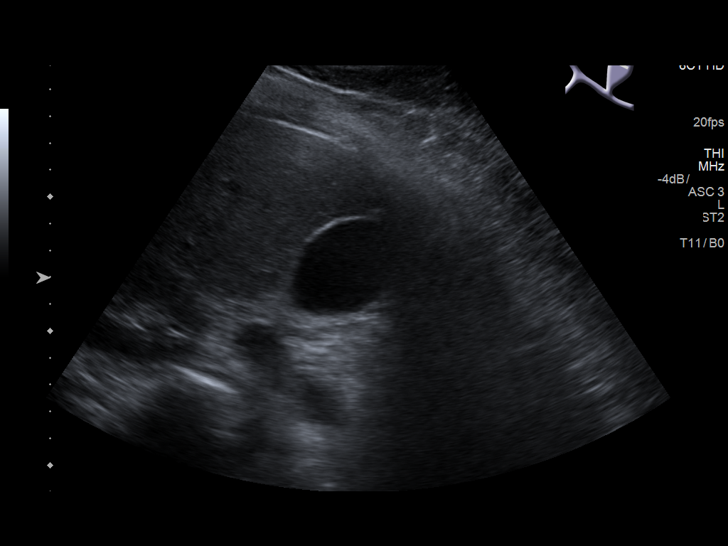
[im 13/51]
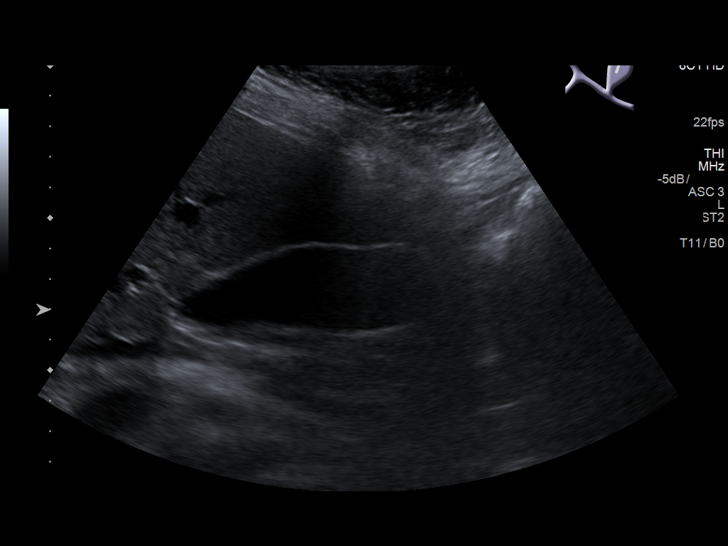
[im 17/51]
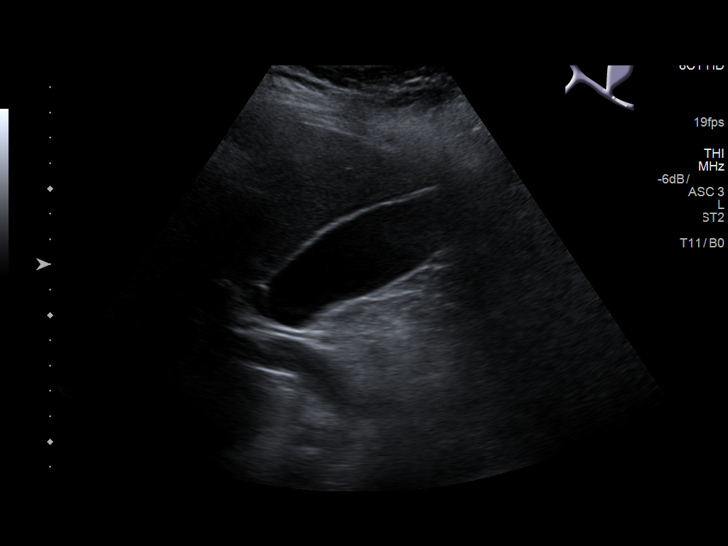
[im 19/51]
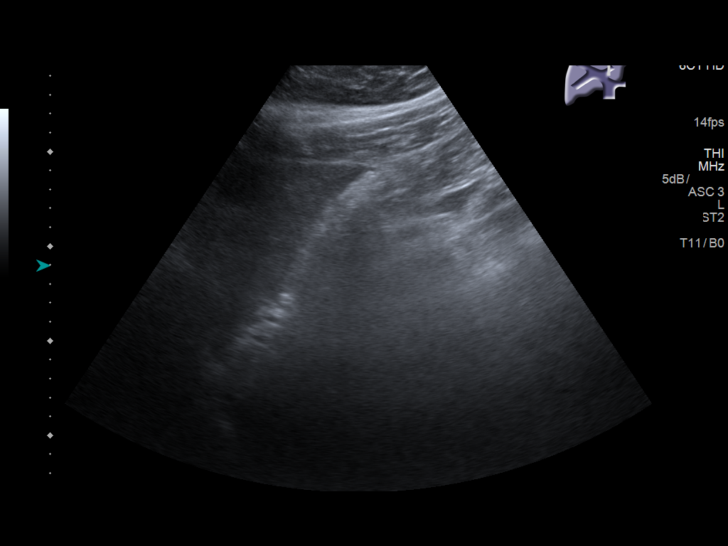
[im 23/51]
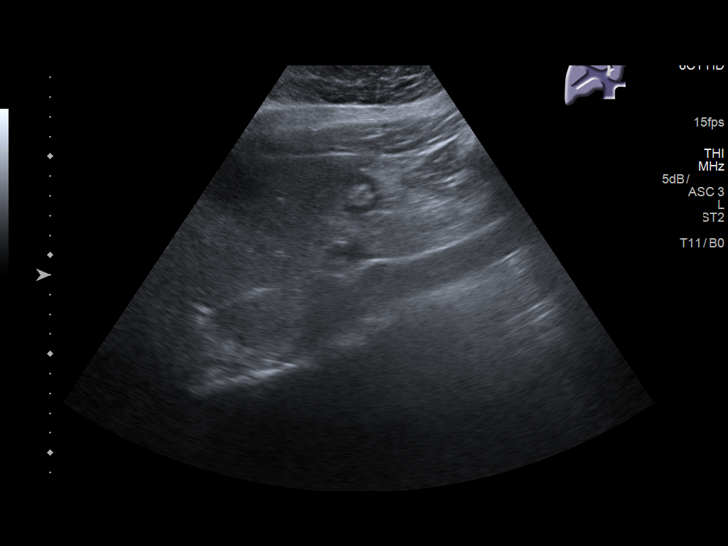
[im 28/51]
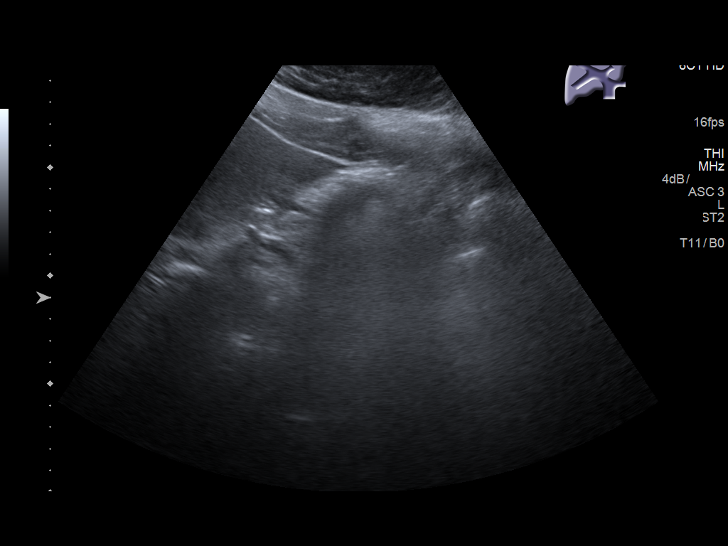
[im 32/51]
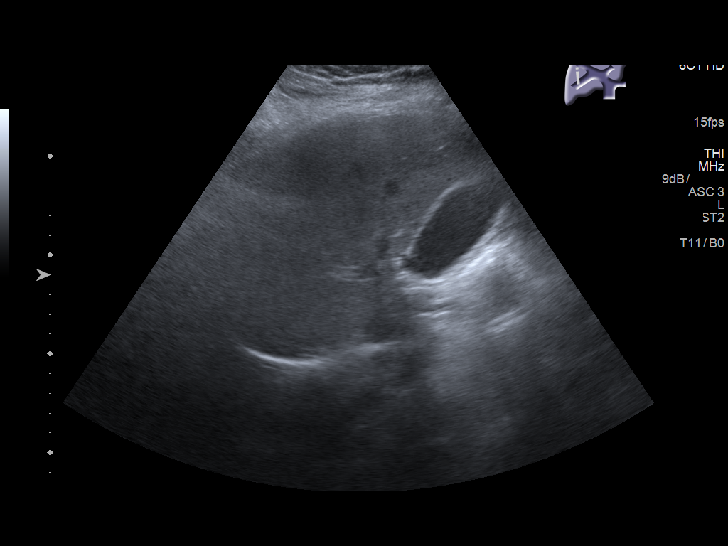
[im 34/51]
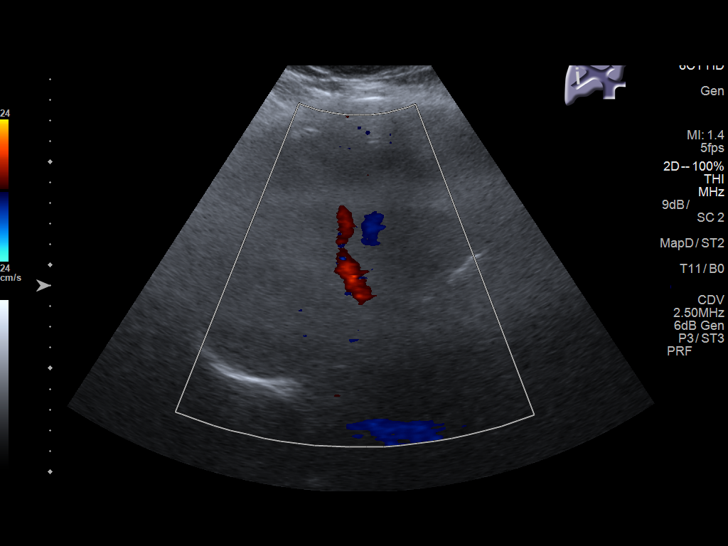
[im 38/51]
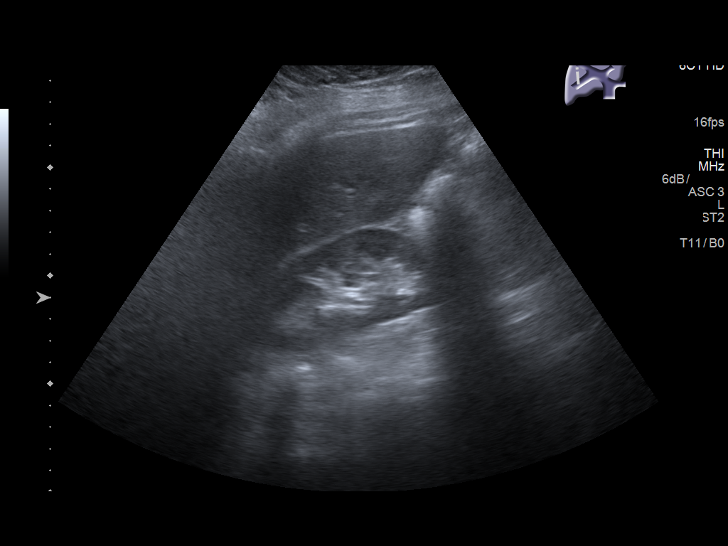
[im 42/51]
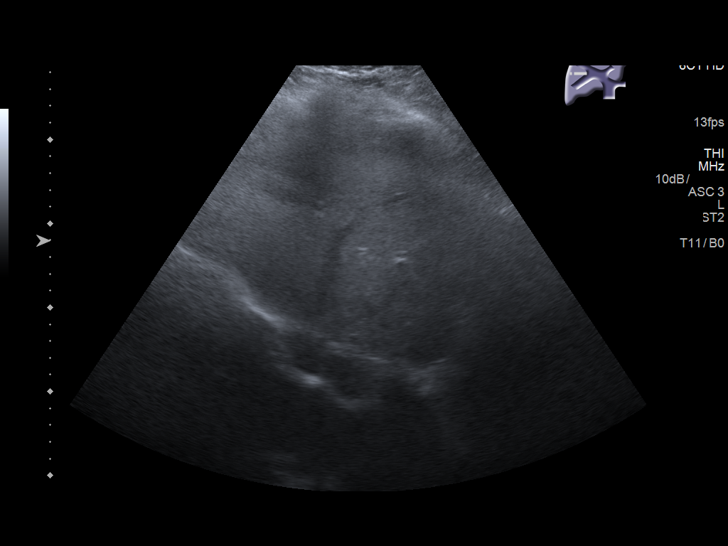
[im 46/51]
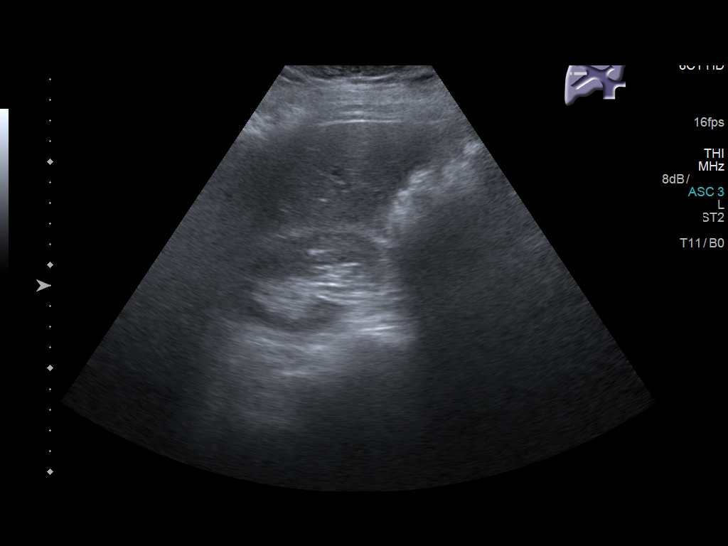
[im 51/51]
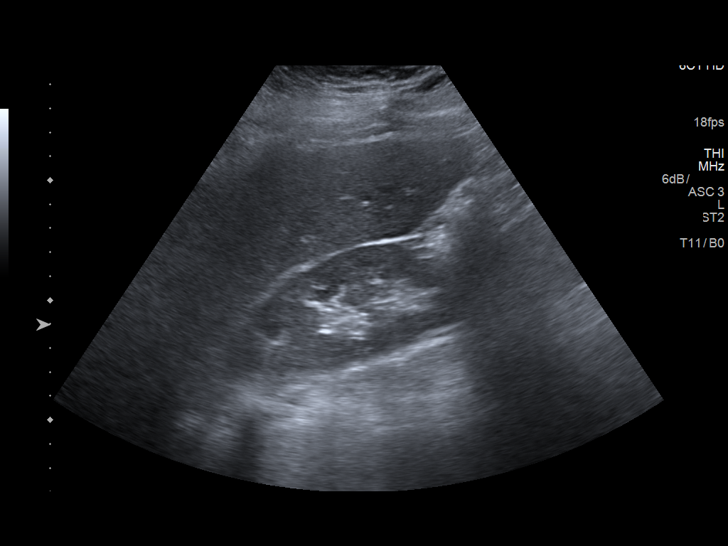

[14 of 25 positions shown; findings below may reference images not displayed]

FINDINGS: Gallbladder:

No gallstones or wall thickening visualized. No sonographic Murphy
sign noted by sonographer.

Common bile duct:

Diameter: 4.0 mm.

Liver:

No focal lesion identified. Within normal limits in parenchymal
echogenicity. Portal vein is patent on color Doppler imaging with
normal direction of blood flow towards the liver.
IMPRESSION: Normal right upper quadrant ultrasound.

## 2019-07-24 ENCOUNTER — Emergency Department
Admission: EM | Admit: 2019-07-24 | Discharge: 2019-07-24 | Disposition: A | Discharge status: Home / Self Care | Payer: Medicaid Other | Source: Home / Self Care | Attending: Emergency Medicine | Admitting: Emergency Medicine

## 2019-07-24 ENCOUNTER — Encounter: Payer: Self-pay | Admitting: Physician Assistant

## 2019-07-24 ENCOUNTER — Emergency Department
Admission: EM | Admit: 2019-07-24 | Discharge: 2019-07-24 | Disposition: A | Discharge status: Home / Self Care | Payer: Medicaid Other | Attending: Emergency Medicine | Admitting: Emergency Medicine

## 2019-07-24 ENCOUNTER — Other Ambulatory Visit: Payer: Self-pay

## 2019-07-24 ENCOUNTER — Emergency Department: Payer: Medicaid Other

## 2019-07-24 DIAGNOSIS — Y92410 Unspecified street and highway as the place of occurrence of the external cause: Secondary | ICD-10-CM | POA: Insufficient documentation

## 2019-07-24 DIAGNOSIS — G44309 Post-traumatic headache, unspecified, not intractable: Secondary | ICD-10-CM | POA: Insufficient documentation

## 2019-07-24 DIAGNOSIS — F0781 Postconcussional syndrome: Secondary | ICD-10-CM | POA: Insufficient documentation

## 2019-07-24 DIAGNOSIS — Y939 Activity, unspecified: Secondary | ICD-10-CM | POA: Insufficient documentation

## 2019-07-24 DIAGNOSIS — Y999 Unspecified external cause status: Secondary | ICD-10-CM | POA: Insufficient documentation

## 2019-07-24 DIAGNOSIS — S0001XA Abrasion of scalp, initial encounter: Secondary | ICD-10-CM
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
## 2019-07-24 NOTE — Discharge Instructions (Addendum)
Your exam is normal at this time. There is no evidence of a serious head injury. Take OTC Tylenol and Motrin as needed. Take your nausea medicine as needed. Follow-up with your provider for continued symptoms. Return to the ED as needed.
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
## 2019-07-26 ENCOUNTER — Emergency Department: Payer: Medicaid Other

## 2019-07-26 ENCOUNTER — Encounter: Payer: Self-pay | Admitting: Emergency Medicine

## 2019-07-26 ENCOUNTER — Other Ambulatory Visit: Payer: Self-pay

## 2019-07-26 ENCOUNTER — Emergency Department
Admission: EM | Admit: 2019-07-26 | Discharge: 2019-07-26 | Disposition: A | Discharge status: Home / Self Care | Payer: Medicaid Other | Attending: Emergency Medicine | Admitting: Emergency Medicine

## 2019-07-26 DIAGNOSIS — Y93I9 Activity, other involving external motion: Secondary | ICD-10-CM | POA: Diagnosis not present

## 2019-07-26 DIAGNOSIS — R0789 Other chest pain: Secondary | ICD-10-CM | POA: Insufficient documentation

## 2019-07-26 DIAGNOSIS — Z79899 Other long term (current) drug therapy: Secondary | ICD-10-CM | POA: Diagnosis not present

## 2019-07-26 MED ORDER — LIDOCAINE 5 % EX PTCH
1.0000 | MEDICATED_PATCH | CUTANEOUS | Status: DC
  Administered 2019-07-26: 11:00:00 1 via TRANSDERMAL
  Filled 2019-07-26: qty 1

## 2019-07-26 MED ORDER — IBUPROFEN 600 MG PO TABS: 600.0000 mg | ORAL_TABLET | Freq: Three times a day (TID) | ORAL | 0 refills | Status: DC | PRN

## 2019-07-26 MED ORDER — TRAMADOL HCL 50 MG PO TABS: 50.0000 mg | ORAL_TABLET | Freq: Four times a day (QID) | ORAL | 0 refills | Status: DC | PRN
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
## 2019-07-26 NOTE — Discharge Instructions (Addendum)
Wear Lidoderm patch for 12 hours.  Follow discharge care instruction take medications as directed.  Follow-up PCP.
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
## 2019-08-05 ENCOUNTER — Emergency Department: Payer: Medicaid Other

## 2019-08-05 ENCOUNTER — Other Ambulatory Visit: Payer: Self-pay

## 2019-08-05 ENCOUNTER — Emergency Department
Admission: EM | Admit: 2019-08-05 | Discharge: 2019-08-05 | Disposition: A | Discharge status: Home / Self Care | Payer: Medicaid Other | Attending: Student in an Organized Health Care Education/Training Program | Admitting: Student in an Organized Health Care Education/Training Program

## 2019-08-05 ENCOUNTER — Encounter: Payer: Self-pay | Admitting: Emergency Medicine

## 2019-08-05 DIAGNOSIS — Z9049 Acquired absence of other specified parts of digestive tract: Secondary | ICD-10-CM | POA: Insufficient documentation

## 2019-08-05 DIAGNOSIS — M79671 Pain in right foot: Secondary | ICD-10-CM | POA: Insufficient documentation

## 2019-08-05 DIAGNOSIS — Z79899 Other long term (current) drug therapy: Secondary | ICD-10-CM | POA: Insufficient documentation

## 2019-08-05 MED ORDER — MELOXICAM 15 MG PO TABS: 15.0000 mg | ORAL_TABLET | Freq: Every day | ORAL | 0 refills | Status: DC

## 2019-08-05 NOTE — ED Notes (Signed)
See triage note  Presents with pain to top of right foot  States pain started about 4 days ago  No injury  Good pulses
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
## 2019-08-05 NOTE — Discharge Instructions (Signed)
Please follow up with podiatry for symptoms that are not improving with medication. Wear your boot for the next few days. Return to the ER for symptoms that change or worsen or for new concerns if unable to see podiatry or primary care.

## 2019-08-05 NOTE — ED Triage Notes (Signed)
C/O right foot pain x 4 days.  Denies injury.

## 2019-08-15 ENCOUNTER — Emergency Department: Payer: Medicaid Other

## 2019-08-15 ENCOUNTER — Emergency Department
Admission: EM | Admit: 2019-08-15 | Discharge: 2019-08-15 | Disposition: A | Discharge status: Home / Self Care | Payer: Medicaid Other | Attending: Emergency Medicine | Admitting: Emergency Medicine

## 2019-08-15 ENCOUNTER — Other Ambulatory Visit: Payer: Self-pay

## 2019-08-15 DIAGNOSIS — Y999 Unspecified external cause status: Secondary | ICD-10-CM | POA: Diagnosis not present

## 2019-08-15 DIAGNOSIS — Z79899 Other long term (current) drug therapy: Secondary | ICD-10-CM | POA: Diagnosis not present

## 2019-08-15 DIAGNOSIS — Y929 Unspecified place or not applicable: Secondary | ICD-10-CM | POA: Insufficient documentation

## 2019-08-15 DIAGNOSIS — S99292A Other physeal fracture of phalanx of left toe, initial encounter for closed fracture: Secondary | ICD-10-CM | POA: Diagnosis not present

## 2019-08-15 DIAGNOSIS — Z9049 Acquired absence of other specified parts of digestive tract: Secondary | ICD-10-CM | POA: Insufficient documentation

## 2019-08-15 DIAGNOSIS — W228XXA Striking against or struck by other objects, initial encounter: Secondary | ICD-10-CM | POA: Diagnosis not present

## 2019-08-15 DIAGNOSIS — Y939 Activity, unspecified: Secondary | ICD-10-CM | POA: Diagnosis not present

## 2019-08-15 DIAGNOSIS — R52 Pain, unspecified: Secondary | ICD-10-CM

## 2019-08-15 DIAGNOSIS — S99922A Unspecified injury of left foot, initial encounter: Secondary | ICD-10-CM | POA: Diagnosis present

## 2019-08-15 MED ORDER — IBUPROFEN 600 MG PO TABS
600.0000 mg | ORAL_TABLET | Freq: Once | ORAL | Status: AC
  Administered 2019-08-15: 600 mg via ORAL
  Filled 2019-08-15: qty 1

## 2019-08-15 MED ORDER — TRAMADOL HCL 50 MG PO TABS: 50.0000 mg | ORAL_TABLET | Freq: Four times a day (QID) | ORAL | 0 refills | Status: AC | PRN

## 2019-08-15 MED ORDER — OXYCODONE-ACETAMINOPHEN 5-325 MG PO TABS
1.0000 | ORAL_TABLET | Freq: Once | ORAL | Status: AC
  Administered 2019-08-15: 08:00:00 1 via ORAL
  Filled 2019-08-15: qty 1

## 2019-08-15 MED ORDER — IBUPROFEN 600 MG PO TABS: 600.0000 mg | ORAL_TABLET | Freq: Three times a day (TID) | ORAL | 0 refills | Status: DC | PRN

## 2019-08-15 NOTE — Discharge Instructions (Signed)
Keep toes buddy tape for 2 weeks.  May remove for showering personal hygiene.  Wear postop shoe for 2 weeks.  Follow-up podiatry if no improvement in 1 week.

## 2019-08-15 NOTE — ED Provider Notes (Signed)
[facility] Emergency Department Provider Note   ____________________________________________   None    (approximate)  I have reviewed the triage vital signs and the nursing notes.   HISTORY  Chief Complaint Toe Injury    HPI Kathryn Valencia is a 34 y.o. female patient presents with pain to the left fifth toe secondary to a "stubbing" incident yesterday.  Patient rates the pain 7/10.  Patient described pain as "achy".  No palliative measure for complaint.         Past Medical History:  Diagnosis Date  . Abdominal pain   . Acoustic neuroma ([place]) 01/2015  . Acoustic neuroma ([place])   . Chronic headaches   . Depression   . Diarrhea, unspecified   . GERD (gastroesophageal reflux disease)   . Morbid obesity ([place])   . Non-intractable cyclical vomiting with nausea   . PONV (postoperative nausea and vomiting)   . UTI (urinary tract infection) 03/20/2017   DX IN ED ON 03-20-17 AND IS CURRENTLY TAKING BACTRIM X 3 DAYS BID    There are no problems to display for this patient.   Past Surgical History:  Procedure Laterality Date  . CHOLECYSTECTOMY N/A 03/25/2017   Procedure: LAPAROSCOPIC CHOLECYSTECTOMY;  Surgeon: Herbert Pun, MD;  Location: [facility] ORS;  Service: General;  Laterality: N/A;  . 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  . ESOPHAGOGASTRODUODENOSCOPY (EGD) WITH PROPOFOL N/A 08/17/2017   Procedure: ESOPHAGOGASTRODUODENOSCOPY (EGD) WITH PROPOFOL;  Surgeon: Manya Silvas, MD;  Location: [facility] ENDOSCOPY;  Service: Endoscopy;  Laterality: N/A;    Prior to Admission medications   Medication Sig Start Date End Date Taking? Authorizing Provider  acetaminophen (TYLENOL) 500 MG tablet Take 1,000 mg by mouth every 6 (six) hours as needed for mild pain or moderate pain.    [provider]  cetirizine (ZYRTEC) 5 MG tablet Take 5 mg by mouth daily as needed for allergies.    [provider]  esomeprazole  (NEXIUM) 20 MG capsule Take 20 mg by mouth every morning. Continuous     [provider]  fluticasone (FLONASE) 50 MCG/ACT nasal spray Place 2 sprays into both nostrils daily.    [provider]  ibuprofen (ADVIL) 600 MG tablet Take 1 tablet (600 mg total) by mouth every 8 (eight) hours as needed. 08/15/19   Sable Feil, PA-C  meloxicam (MOBIC) 15 MG tablet Take 1 tablet (15 mg total) by mouth daily. 08/05/19   Triplett, Dessa Phi, FNP  Multiple Vitamins-Minerals (MULTIVITAMIN WITH MINERALS) tablet Take 1 tablet by mouth daily.    [provider]  Norgestimate-Ethinyl Estradiol Triphasic 0.18/0.215/0.25 MG-25 MCG tab Take 1 tablet by mouth daily. 01/28/17   [provider]  sertraline (ZOLOFT) 25 MG tablet Take 25 mg by mouth every morning.  08/19/16   [provider]  traMADol (ULTRAM) 50 MG tablet Take 1 tablet (50 mg total) by mouth every 6 (six) hours as needed for up to 5 days. 08/15/19 08/20/19  Sable Feil, PA-C    Allergies Patient has no known allergies.  No family history on file.  Social History Social History   Tobacco Use  . Smoking status: Never Smoker  . Smokeless tobacco: Never Used  Substance Use Topics  . Alcohol use: No  . Drug use: No    Review of Systems Constitutional: No fever/chills Eyes: No visual changes. ENT: No sore throat. Cardiovascular: Denies chest pain. Respiratory: Denies shortness of breath. Gastrointestinal: No  abdominal pain.  No nausea, no vomiting.  No diarrhea.  No constipation. Genitourinary: Negative for dysuria. Musculoskeletal: Painful swollen left fifth toe.. Skin: Negative for rash. Neurological: Negative for headaches, focal weakness or numbness. Psychiatric:  Depression ____________________________________________   PHYSICAL EXAM:  VITAL SIGNS: ED Triage Vitals  Enc Vitals Group     BP 08/15/19 0653 114/61     Pulse Rate 08/15/19 0653 60     Resp 08/15/19 0653 20     Temp  08/15/19 0653 97.7 F (36.5 C)     Temp Source 08/15/19 0653 Oral     SpO2 08/15/19 0653 98 %     Weight 08/15/19 0650 288 lb (130.6 kg)     Height 08/15/19 0650 5\' 7"  (1.702 m)     Head Circumference --      Peak Flow --      Pain Score 08/15/19 0650 7     Pain Loc --      Pain Edu? --      Excl. in [place]? --    Constitutional: Alert and oriented. Well appearing and in no acute distress.  Obesity. Cardiovascular: Normal rate, regular rhythm. Grossly normal heart sounds.  Good peripheral circulation. Respiratory: Normal respiratory effort.  No retractions. Lungs CTAB. Musculoskeletal: No obvious deformity to the left fifth toe.  Patient has moderate guarding with palpation.   Neurologic:  Normal speech and language. No gross focal neurologic deficits are appreciated. No gait instability. Skin:  Skin is warm, dry and intact. No rash noted.  Mild edema lateral aspect of left fifth toe. Psychiatric: Mood and affect are normal. Speech and behavior are normal.  ____________________________________________   LABS (all labs ordered are listed, but only abnormal results are displayed)  Labs Reviewed - No data to display ____________________________________________  EKG   ____________________________________________  RADIOLOGY  ED MD interpretation:    Official radiology report(s): DG Foot Complete Left  Result Date: 08/15/2019 CLINICAL DATA:  Patient reports stubbing left pinky toe yesterday. Reports pain, bruising and swelling to toe today. EXAM: LEFT FOOT - COMPLETE 3+ VIEW COMPARISON:  None. FINDINGS: Nondisplaced fracture of the medial base of the middle phalanx of the fifth toe, which intersects the PIP joint articular surface. No other fractures.  Joints are normally spaced and aligned. Mild fifth toe soft tissue swelling. Soft tissues otherwise unremarkable. IMPRESSION: 1. Nondisplaced fracture of the medial base of the middle phalanx of the left fifth toe. 2. No other fractures.   No dislocation. Electronically Signed   By: Lajean Manes M.D.   On: 08/15/2019 07:29    ____________________________________________   PROCEDURES  Procedure(s) performed (including Critical Care):  Procedures   ____________________________________________   INITIAL IMPRESSION / ASSESSMENT AND PLAN / ED COURSE  As part of my medical decision making, I reviewed the following data within the [place]     Patient presents with left fifth toe pain secondary to a "stubbing" incident last night.  Discussed x-rays findings showing a nondisplaced fracture of the left fifth toe.  Patient toe was buddy taped and she was given open shoe to assist with ambulation.  Patient given discharge care instruction advised follow podiatry if no improvement in 1 week.    Selest Yox was evaluated in Emergency Department on 08/15/2019 for the symptoms described in the history of present illness. She was evaluated in the context of the global COVID-19 pandemic, which necessitated consideration that the patient might be at risk for infection with the SARS-CoV-2  virus that causes COVID-19. Institutional protocols and algorithms that pertain to the evaluation of patients at risk for COVID-19 are in a state of rapid change based on information released by regulatory bodies including the CDC and federal and state organizations. These policies and algorithms were followed during the patient's care in the ED.       ____________________________________________   FINAL CLINICAL IMPRESSION(S) / ED DIAGNOSES  Final diagnoses:  Pain  Other physeal fracture of phalanx of left toe, initial encounter for closed fracture     ED Discharge Orders         Ordered    traMADol (ULTRAM) 50 MG tablet  Every 6 hours PRN     08/15/19 0746    ibuprofen (ADVIL) 600 MG tablet  Every 8 hours PRN     08/15/19 0746           Note:  This document was prepared using Dragon voice recognition  software and may include unintentional dictation errors.    Sable Feil, PA-C 08/15/19 0750    Lavonia Drafts, MD 08/15/19 [phone]

## 2019-08-15 NOTE — ED Triage Notes (Signed)
Patient reports stubbing left pinky toe yesterday. Reports pain and swelling to toe today.

## 2019-08-20 ENCOUNTER — Emergency Department: Payer: Medicaid Other

## 2019-08-20 ENCOUNTER — Encounter: Payer: Self-pay | Admitting: Emergency Medicine

## 2019-08-20 ENCOUNTER — Emergency Department
Admission: EM | Admit: 2019-08-20 | Discharge: 2019-08-20 | Disposition: A | Discharge status: Home / Self Care | Payer: Medicaid Other | Attending: Emergency Medicine | Admitting: Emergency Medicine

## 2019-08-20 ENCOUNTER — Other Ambulatory Visit: Payer: Self-pay

## 2019-08-20 DIAGNOSIS — S92535S Nondisplaced fracture of distal phalanx of left lesser toe(s), sequela: Secondary | ICD-10-CM | POA: Insufficient documentation

## 2019-08-20 DIAGNOSIS — Z79899 Other long term (current) drug therapy: Secondary | ICD-10-CM | POA: Insufficient documentation

## 2019-08-20 DIAGNOSIS — Z9049 Acquired absence of other specified parts of digestive tract: Secondary | ICD-10-CM | POA: Diagnosis not present

## 2019-08-20 DIAGNOSIS — R52 Pain, unspecified: Secondary | ICD-10-CM

## 2019-08-20 DIAGNOSIS — X58XXXS Exposure to other specified factors, sequela: Secondary | ICD-10-CM | POA: Diagnosis not present

## 2019-08-20 DIAGNOSIS — S99922S Unspecified injury of left foot, sequela: Secondary | ICD-10-CM | POA: Diagnosis present

## 2019-08-20 MED ORDER — OXYCODONE-ACETAMINOPHEN 7.5-325 MG PO TABS: 1.0000 | ORAL_TABLET | Freq: Four times a day (QID) | ORAL | 0 refills | Status: DC | PRN

## 2019-08-20 NOTE — ED Provider Notes (Signed)
[facility] Emergency Department Provider Note   ____________________________________________   First MD Initiated Contact with Patient 08/20/19 1251     (approximate)  I have reviewed the triage vital signs and the nursing notes.   HISTORY  Chief Complaint Toe Pain    HPI Kathryn Valencia is a 34 y.o. female patient states left fifth toe pain has increased status post her last visit 5 days ago.  Patient was diagnosed with nondisplaced fracture medial base of the middle phalanx of the left fifth toe.  Patient prescribed ibuprofen and tramadol.  Patient rates her pain as an 8/10.  Patient described pain as "achy".  Patient has toe buddy taped.     Past Medical History:  Diagnosis Date  . Abdominal pain   . Acoustic neuroma ([place]) 01/2015  . Acoustic neuroma ([place])   . Chronic headaches   . Depression   . Diarrhea, unspecified   . GERD (gastroesophageal reflux disease)   . Morbid obesity ([place])   . Non-intractable cyclical vomiting with nausea   . PONV (postoperative nausea and vomiting)   . UTI (urinary tract infection) 03/20/2017   DX IN ED ON 03-20-17 AND IS CURRENTLY TAKING BACTRIM X 3 DAYS BID    There are no problems to display for this patient.   Past Surgical History:  Procedure Laterality Date  . CHOLECYSTECTOMY N/A 03/25/2017   Procedure: LAPAROSCOPIC CHOLECYSTECTOMY;  Surgeon: Herbert Pun, MD;  Location: [facility] ORS;  Service: General;  Laterality: N/A;  . 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  . ESOPHAGOGASTRODUODENOSCOPY (EGD) WITH PROPOFOL N/A 08/17/2017   Procedure: ESOPHAGOGASTRODUODENOSCOPY (EGD) WITH PROPOFOL;  Surgeon: Manya Silvas, MD;  Location: [facility] ENDOSCOPY;  Service: Endoscopy;  Laterality: N/A;    Prior to Admission medications   Medication Sig Start Date End Date Taking? Authorizing Provider  acetaminophen (TYLENOL) 500 MG tablet Take 1,000 mg by mouth every 6 (six) hours as needed for  mild pain or moderate pain.    [provider]  cetirizine (ZYRTEC) 5 MG tablet Take 5 mg by mouth daily as needed for allergies.    [provider]  esomeprazole (NEXIUM) 20 MG capsule Take 20 mg by mouth every morning. Continuous     [provider]  fluticasone (FLONASE) 50 MCG/ACT nasal spray Place 2 sprays into both nostrils daily.    [provider]  ibuprofen (ADVIL) 600 MG tablet Take 1 tablet (600 mg total) by mouth every 8 (eight) hours as needed. 08/15/19   Sable Feil, PA-C  meloxicam (MOBIC) 15 MG tablet Take 1 tablet (15 mg total) by mouth daily. 08/05/19   Triplett, Dessa Phi, FNP  Multiple Vitamins-Minerals (MULTIVITAMIN WITH MINERALS) tablet Take 1 tablet by mouth daily.    [provider]  Norgestimate-Ethinyl Estradiol Triphasic 0.18/0.215/0.25 MG-25 MCG tab Take 1 tablet by mouth daily. 01/28/17   [provider]  oxyCODONE-acetaminophen (PERCOCET) 7.5-325 MG tablet Take 1 tablet by mouth every 6 (six) hours as needed for severe pain. 08/20/19   Sable Feil, PA-C  sertraline (ZOLOFT) 25 MG tablet Take 25 mg by mouth every morning.  08/19/16   [provider]  traMADol (ULTRAM) 50 MG tablet Take 1 tablet (50 mg total) by mouth every 6 (six) hours as needed for up to 5 days. 08/15/19 08/20/19  Sable Feil, PA-C    Allergies Patient has no known allergies.  No family history on file.  Social History Social History  Tobacco Use  . Smoking status: Never Smoker  . Smokeless tobacco: Never Used  Substance Use Topics  . Alcohol use: No  . Drug use: No    Review of Systems  Constitutional: No fever/chills Eyes: No visual changes. ENT: No sore throat. Cardiovascular: Denies chest pain. Respiratory: Denies shortness of breath. Gastrointestinal: No abdominal pain.  No nausea, no vomiting.  No diarrhea.  No constipation. Genitourinary: Negative for dysuria. Musculoskeletal: Left fifth toe pain Skin: Negative  for rash. Neurological: Negative for headaches, focal weakness or numbness.   ____________________________________________   PHYSICAL EXAM:  VITAL SIGNS: ED Triage Vitals  Enc Vitals Group     BP 08/20/19 1144 114/68     Pulse Rate 08/20/19 1144 65     Resp 08/20/19 1144 16     Temp 08/20/19 1144 98.3 F (36.8 C)     Temp Source 08/20/19 1144 Oral     SpO2 08/20/19 1144 98 %     Weight --      Height --      Head Circumference --      Peak Flow --      Pain Score 08/20/19 1142 8     Pain Loc --      Pain Edu? --      Excl. in [place]? --    Constitutional: Alert and oriented. Well appearing and in no acute distress.  Morbid obesity. Cardiovascular: Normal rate, regular rhythm. Grossly normal heart sounds.  Good peripheral circulation. Respiratory: Normal respiratory effort.  No retractions. Lungs CTAB. Musculoskeletal: Toes are buddy taped.  No obvious deformity.  Moderate guarding palpation dorsal aspect of the fifth metatarsal.   Neurologic:  Normal speech and language. No gross focal neurologic deficits are appreciated. No gait instability. Skin:  Skin is warm, dry and intact. No rash noted. Psychiatric: Mood and affect are normal. Speech and behavior are normal.  ____________________________________________   LABS (all labs ordered are listed, but only abnormal results are displayed)  Labs Reviewed - No data to display ____________________________________________  EKG   ____________________________________________  RADIOLOGY  ED MD interpretation:    Official radiology report(s): DG Foot Complete Left  Result Date: 08/20/2019 CLINICAL DATA:  Pt states she broke left pinky toe last Sunday and pt states swelling hasn't subsided and is still very painful. Pt stayed home from work Monday and Tuesday. Pt denies prior injury or surgery. EXAM: LEFT FOOT - COMPLETE 3+ VIEW COMPARISON:  08/15/2019 FINDINGS: Fraction noted at the medial base of the middle phalanx of the  left fifth toe is unchanged from the recent prior study. No other fractures, no bone lesions. Joints normally spaced and aligned.  No arthropathic changes. Soft tissues are unremarkable. IMPRESSION: 1. Fracture of the medial base of the middle phalanx of the left fifth toe, unchanged compared to the prior exam. 2. No other abnormalities. Electronically Signed   By: Lajean Manes M.D.   On: 08/20/2019 13:33    ____________________________________________   PROCEDURES  Procedure(s) performed (including Critical Care):  Procedures   ____________________________________________   INITIAL IMPRESSION / ASSESSMENT AND PLAN / ED COURSE  As part of my medical decision making, I reviewed the following data within the [place]     Patient presents for increased left fifth toe pain status post fracture seen on her last visit.  Discussed x-ray findings today showing no change from previous initial film.  Patient given discharge care instruction and advised to take medication as directed.  Manjit Stidd was evaluated in Emergency Department on 08/20/2019 for the symptoms described in the history of present illness. She was evaluated in the context of the global COVID-19 pandemic, which necessitated consideration that the patient might be at risk for infection with the SARS-CoV-2 virus that causes COVID-19. Institutional protocols and algorithms that pertain to the evaluation of patients at risk for COVID-19 are in a state of rapid change based on information released by regulatory bodies including the CDC and federal and state organizations. These policies and algorithms were followed during the patient's care in the ED.       ____________________________________________   FINAL CLINICAL IMPRESSION(S) / ED DIAGNOSES  Final diagnoses:  Nondisplaced fracture of distal phalanx of left lesser toe(s), sequela     ED Discharge Orders         Ordered     oxyCODONE-acetaminophen (PERCOCET) 7.5-325 MG tablet  Every 6 hours PRN     08/20/19 1352           Note:  This document was prepared using Dragon voice recognition software and may include unintentional dictation errors.    Sable Feil, PA-C 08/20/19 1356    Vanessa , MD 08/21/19 [phone]

## 2019-08-20 NOTE — ED Triage Notes (Signed)
Pt to ED via POV, states that she broke her toe last week. Feels like it is worse now. Pt is in NAD.

## 2019-08-20 NOTE — Discharge Instructions (Signed)
Follow discharge care instruction take medication as directed. °

## 2019-08-20 NOTE — ED Notes (Signed)
Pt broke left pinky toe last Sunday and pt states swelling hasn't subsided and is still very painful. Pt stayed home from work Monday and Tuesday.

## 2019-09-20 ENCOUNTER — Emergency Department
Admission: EM | Admit: 2019-09-20 | Discharge: 2019-09-20 | Disposition: A | Discharge status: Home / Self Care | Payer: Medicaid Other | Attending: Emergency Medicine | Admitting: Emergency Medicine

## 2019-09-20 ENCOUNTER — Other Ambulatory Visit: Payer: Self-pay

## 2019-09-20 DIAGNOSIS — Z20822 Contact with and (suspected) exposure to covid-19: Secondary | ICD-10-CM | POA: Diagnosis not present

## 2019-09-20 DIAGNOSIS — R197 Diarrhea, unspecified: Secondary | ICD-10-CM | POA: Diagnosis not present

## 2019-09-20 DIAGNOSIS — R112 Nausea with vomiting, unspecified: Secondary | ICD-10-CM | POA: Diagnosis present

## 2019-09-20 LAB — COMPREHENSIVE METABOLIC PANEL
ALT: 14 U/L (ref 0–44)
AST: 17 U/L (ref 15–41)
Albumin: 4.2 g/dL (ref 3.5–5.0)
Alkaline Phosphatase: 33 U/L — ABNORMAL LOW (ref 38–126)
Anion gap: 5 (ref 5–15)
BUN: 10 mg/dL (ref 6–20)
CO2: 27 mmol/L (ref 22–32)
Calcium: 8.9 mg/dL (ref 8.9–10.3)
Chloride: 107 mmol/L (ref 98–111)
Creatinine, Ser: 0.72 mg/dL (ref 0.44–1.00)
GFR calc Af Amer: >60 mL/min (ref >60)
GFR calc non Af Amer: >60 mL/min (ref >60)
Glucose, Bld: 99 mg/dL (ref 70–99)
Potassium: 4.4 mmol/L (ref 3.5–5.1)
Sodium: 139 mmol/L (ref 135–145)
Total Bilirubin: 0.8 mg/dL (ref 0.3–1.2)
Total Protein: 7.5 g/dL (ref 6.5–8.1)

## 2019-09-20 LAB — URINALYSIS, COMPLETE (UACMP) WITH MICROSCOPIC
Bacteria, UA: NONE SEEN
Bilirubin Urine: NEGATIVE
Glucose, UA: NEGATIVE mg/dL
Ketones, ur: NEGATIVE mg/dL
Leukocytes,Ua: NEGATIVE
Nitrite: NEGATIVE
Protein, ur: NEGATIVE mg/dL
Specific Gravity, Urine: 1.019 (ref 1.005–1.030)
pH: 6 (ref 5.0–8.0)

## 2019-09-20 LAB — CBC
HCT: 40.1 % (ref 36.0–46.0)
Hemoglobin: 13.4 g/dL (ref 12.0–15.0)
MCH: 31.5 pg (ref 26.0–34.0)
MCHC: 33.4 g/dL (ref 30.0–36.0)
MCV: 94.1 fL (ref 80.0–100.0)
Platelets: 229 10*3/uL (ref 150–400)
RBC: 4.26 MIL/uL (ref 3.87–5.11)
RDW: 13.3 % (ref 11.5–15.5)
WBC: 7.2 10*3/uL (ref 4.0–10.5)
nRBC: 0 % (ref 0.0–0.2)

## 2019-09-20 LAB — SARS CORONAVIRUS 2 (TAT 6-24 HRS): SARS Coronavirus 2: NEGATIVE

## 2019-09-20 LAB — POCT PREGNANCY, URINE: Preg Test, Ur: NEGATIVE

## 2019-09-20 LAB — LIPASE, BLOOD: Lipase: 26 U/L (ref 11–51)

## 2019-09-20 MED ORDER — SODIUM CHLORIDE 0.9% FLUSH: 3.0000 mL | Freq: Once | INTRAVENOUS | Status: DC

## 2019-09-20 MED ORDER — ONDANSETRON 4 MG PO TBDP: 4.0000 mg | ORAL_TABLET | Freq: Three times a day (TID) | ORAL | 0 refills | Status: DC | PRN

## 2019-09-20 MED ORDER — ONDANSETRON 4 MG PO TBDP
4.0000 mg | ORAL_TABLET | Freq: Once | ORAL | Status: AC
  Administered 2019-09-20: 4 mg via ORAL
  Filled 2019-09-20: qty 1

## 2019-09-20 NOTE — ED Provider Notes (Signed)
[facility] Emergency Department Provider Note  ____________________________________________   First MD Initiated Contact with Patient 09/20/19 [phone]     (approximate)  I have reviewed the triage vital signs and the nursing notes.   HISTORY  Chief Complaint Emesis and Diarrhea    HPI Kathryn Valencia is a 34 y.o. female with history of GERD, obesity, acoustic neuroma, here with abdominal pain, nausea, vomiting, and diarrhea.  The patient states she has had some mild abdominal discomfort throughout the day yesterday.  She states that she went to work this morning.  She had some mild stomach discomfort going to work.  While there, she began to develop nausea followed by several episodes of nonbloody, nonbilious emesis.  She also had one loose bowel movement.  There is no blood in her emesis or diarrhea.  No fevers or chills.  No recent sick contacts redness with food intakes.  No recent travel.       Past Medical History:  Diagnosis Date  . Abdominal pain   . Acoustic neuroma ([place]) 01/2015  . Acoustic neuroma ([place])   . Chronic headaches   . Depression   . Diarrhea, unspecified   . GERD (gastroesophageal reflux disease)   . Morbid obesity ([place])   . Non-intractable cyclical vomiting with nausea   . PONV (postoperative nausea and vomiting)   . UTI (urinary tract infection) 03/20/2017   DX IN ED ON 03-20-17 AND IS CURRENTLY TAKING BACTRIM X 3 DAYS BID    There are no problems to display for this patient.   Past Surgical History:  Procedure Laterality Date  . CHOLECYSTECTOMY N/A 03/25/2017   Procedure: LAPAROSCOPIC CHOLECYSTECTOMY;  Surgeon: Herbert Pun, MD;  Location: [facility] ORS;  Service: General;  Laterality: N/A;  . 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  . ESOPHAGOGASTRODUODENOSCOPY (EGD) WITH PROPOFOL N/A 08/17/2017   Procedure: ESOPHAGOGASTRODUODENOSCOPY (EGD) WITH PROPOFOL;  Surgeon: Manya Silvas, MD;  Location:  [facility] ENDOSCOPY;  Service: Endoscopy;  Laterality: N/A;    Prior to Admission medications   Medication Sig Start Date End Date Taking? Authorizing Provider  acetaminophen (TYLENOL) 500 MG tablet Take 1,000 mg by mouth every 6 (six) hours as needed for mild pain or moderate pain.    [provider]  cetirizine (ZYRTEC) 5 MG tablet Take 5 mg by mouth daily as needed for allergies.    [provider]  esomeprazole (NEXIUM) 20 MG capsule Take 20 mg by mouth every morning. Continuous     [provider]  fluticasone (FLONASE) 50 MCG/ACT nasal spray Place 2 sprays into both nostrils daily.    [provider]  ibuprofen (ADVIL) 600 MG tablet Take 1 tablet (600 mg total) by mouth every 8 (eight) hours as needed. 08/15/19   Sable Feil, PA-C  meloxicam (MOBIC) 15 MG tablet Take 1 tablet (15 mg total) by mouth daily. 08/05/19   Triplett, Dessa Phi, FNP  Multiple Vitamins-Minerals (MULTIVITAMIN WITH MINERALS) tablet Take 1 tablet by mouth daily.    [provider]  Norgestimate-Ethinyl Estradiol Triphasic 0.18/0.215/0.25 MG-25 MCG tab Take 1 tablet by mouth daily. 01/28/17   [provider]  ondansetron (ZOFRAN ODT) 4 MG disintegrating tablet Take 1 tablet (4 mg total) by mouth every 8 (eight) hours as needed for nausea or vomiting. 09/20/19   Duffy Bruce, MD  oxyCODONE-acetaminophen (PERCOCET) 7.5-325 MG tablet Take 1 tablet by mouth every 6 (six) hours as needed for severe pain. 08/20/19  Sable Feil, PA-C  sertraline (ZOLOFT) 25 MG tablet Take 25 mg by mouth every morning.  08/19/16   [provider]    Allergies Patient has no known allergies.  No family history on file.  Social History Social History   Tobacco Use  . Smoking status: Never Smoker  . Smokeless tobacco: Never Used  Substance Use Topics  . Alcohol use: No  . Drug use: No    Review of Systems  Review of Systems  Constitutional: Positive for fatigue. Negative  for fever.  HENT: Negative for congestion and sore throat.   Eyes: Negative for visual disturbance.  Respiratory: Negative for cough and shortness of breath.   Cardiovascular: Negative for chest pain.  Gastrointestinal: Positive for abdominal pain, diarrhea, nausea and vomiting.  Genitourinary: Negative for flank pain.  Musculoskeletal: Negative for back pain and neck pain.  Skin: Negative for rash and wound.  Neurological: Negative for weakness.     ____________________________________________  PHYSICAL EXAM:      VITAL SIGNS: ED Triage Vitals  Enc Vitals Group     BP 09/20/19 0812 100/67     Pulse Rate 09/20/19 0812 65     Resp 09/20/19 0812 16     Temp 09/20/19 0816 98.2 F (36.8 C)     Temp Source 09/20/19 0812 Oral     SpO2 09/20/19 0812 100 %     Weight 09/20/19 0813 288 lb (130.6 kg)     Height 09/20/19 0813 5\' 7"  (1.702 m)     Head Circumference --      Peak Flow --      Pain Score 09/20/19 0813 7     Pain Loc --      Pain Edu? --      Excl. in [place]? --      Physical Exam Vitals and nursing note reviewed.  Constitutional:      General: She is not in acute distress.    Appearance: She is well-developed.  HENT:     Head: Normocephalic and atraumatic.     Mouth/Throat:     Mouth: Mucous membranes are dry.  Eyes:     Conjunctiva/sclera: Conjunctivae normal.  Cardiovascular:     Rate and Rhythm: Normal rate and regular rhythm.     Heart sounds: Normal heart sounds.  Pulmonary:     Effort: Pulmonary effort is normal. No respiratory distress.     Breath sounds: No wheezing.  Abdominal:     General: Abdomen is flat. There is no distension.     Comments: Specifically, no right upper quadrant or right lower quadrant tenderness.  No CVA tenderness.  Musculoskeletal:     Cervical back: Neck supple.  Skin:    General: Skin is warm.     Capillary Refill: Capillary refill takes less than 2 seconds.     Findings: No rash.  Neurological:     Mental Status: She is  alert and oriented to person, place, and time.     Motor: No abnormal muscle tone.       ____________________________________________   LABS (all labs ordered are listed, but only abnormal results are displayed)  Labs Reviewed  COMPREHENSIVE METABOLIC PANEL - Abnormal; Notable for the following components:      Result Value   Alkaline Phosphatase 33 (*)    All other components within normal limits  URINALYSIS, COMPLETE (UACMP) WITH MICROSCOPIC - Abnormal; Notable for the following components:   Color, Urine YELLOW (*)    APPearance HAZY (*)  Hgb urine dipstick MODERATE (*)    All other components within normal limits  SARS CORONAVIRUS 2 (TAT 6-24 HRS)  LIPASE, BLOOD  CBC  POC URINE PREG, ED  POCT PREGNANCY, URINE    ____________________________________________  EKG: Normal sinus rhythm, ventricular rate 60.  PR 152, QRS 84, QTc 410.  No acute ST elevations or depressions.  No ischemia or infarct. ________________________________________  RADIOLOGY All imaging, including plain films, CT scans, and ultrasounds, independently reviewed by me, and interpretations confirmed via formal radiology reads.  ED MD interpretation:   None  Official radiology report(s): No results found.  ____________________________________________  PROCEDURES   Procedure(s) performed (including Critical Care):  Procedures  ____________________________________________  INITIAL IMPRESSION / MDM / [place] / ED COURSE  As part of my medical decision making, I reviewed the following data within the [place] notes reviewed and incorporated, Old chart reviewed, Notes from prior ED visits, and [place] Controlled Substance Database       *Laily Lessner was evaluated in Emergency Department on 09/20/2019 for the symptoms described in the history of present illness. She was evaluated in the context of the global COVID-19 pandemic, which necessitated  consideration that the patient might be at risk for infection with the SARS-CoV-2 virus that causes COVID-19. Institutional protocols and algorithms that pertain to the evaluation of patients at risk for COVID-19 are in a state of rapid change based on information released by regulatory bodies including the CDC and federal and state organizations. These policies and algorithms were followed during the patient's care in the ED.  Some ED evaluations and interventions may be delayed as a result of limited staffing during the pandemic.*     Medical Decision Making: 34 year old female here with mild abdominal pain, nausea, vomiting, and diarrhea.  Her abdomen is soft and nontender.  White blood cell count, LFTs, renal function are all within normal limits.  Her abdomen is soft, nontender, with no focal tenderness to suggest cholecystitis, appendicitis, or other intra-abdominal emergency.  Suspect possible viral illness versus foodborne illness.  She is tolerating p.o. without difficulty.  She appears well-hydrated.  Given reassuring exam and vitals, do not feel further imaging is indicated at this time.  Will treat supportively with good return precautions.    ____________________________________________  FINAL CLINICAL IMPRESSION(S) / ED DIAGNOSES  Final diagnoses:  Nausea vomiting and diarrhea     MEDICATIONS GIVEN DURING THIS VISIT:  Medications  ondansetron (ZOFRAN-ODT) disintegrating tablet 4 mg (4 mg Oral Given 09/20/19 1016)     ED Discharge Orders         Ordered    ondansetron (ZOFRAN ODT) 4 MG disintegrating tablet  Every 8 hours PRN     09/20/19 1120           Note:  This document was prepared using Dragon voice recognition software and may include unintentional dictation errors.   Duffy Bruce, MD 09/20/19 1245

## 2019-09-20 NOTE — Discharge Instructions (Addendum)
Try to drink plenty of fluid.  Eat a bland diet, stay away from spicy foods.  It is okay to take over-the-counter Imodium or antidiarrheal medications.  I prescribed an antinausea medication.  I have written out of work until Thursday.  Have also sent a Covid test which should be back in the next 48 hours.

## 2019-09-20 NOTE — ED Triage Notes (Signed)
Pt states she got all hot and dizzy with N/V/D this morning while at work , states she had an episode of chest pain for a few minutes, denies any at present. Pt is in NAD at present.

## 2019-09-20 NOTE — ED Notes (Signed)
Pt has kept water down. Denies wanting anything else at this time. Reading a book. No distress noted.

## 2019-09-30 ENCOUNTER — Emergency Department
Admission: EM | Admit: 2019-09-30 | Discharge: 2019-09-30 | Disposition: A | Discharge status: Home / Self Care | Payer: Medicaid Other | Attending: Student in an Organized Health Care Education/Training Program | Admitting: Student in an Organized Health Care Education/Training Program

## 2019-09-30 ENCOUNTER — Other Ambulatory Visit: Payer: Self-pay

## 2019-09-30 ENCOUNTER — Encounter: Payer: Self-pay | Admitting: Emergency Medicine

## 2019-09-30 DIAGNOSIS — W57XXXA Bitten or stung by nonvenomous insect and other nonvenomous arthropods, initial encounter: Secondary | ICD-10-CM | POA: Insufficient documentation

## 2019-09-30 DIAGNOSIS — L03115 Cellulitis of right lower limb: Secondary | ICD-10-CM | POA: Insufficient documentation

## 2019-09-30 DIAGNOSIS — Z79899 Other long term (current) drug therapy: Secondary | ICD-10-CM | POA: Insufficient documentation

## 2019-09-30 DIAGNOSIS — R21 Rash and other nonspecific skin eruption: Secondary | ICD-10-CM | POA: Diagnosis not present

## 2019-09-30 DIAGNOSIS — Y998 Other external cause status: Secondary | ICD-10-CM | POA: Insufficient documentation

## 2019-09-30 DIAGNOSIS — Y9389 Activity, other specified: Secondary | ICD-10-CM | POA: Insufficient documentation

## 2019-09-30 DIAGNOSIS — L0291 Cutaneous abscess, unspecified: Secondary | ICD-10-CM | POA: Insufficient documentation

## 2019-09-30 DIAGNOSIS — S80861A Insect bite (nonvenomous), right lower leg, initial encounter: Secondary | ICD-10-CM | POA: Insufficient documentation

## 2019-09-30 DIAGNOSIS — Y929 Unspecified place or not applicable: Secondary | ICD-10-CM | POA: Insufficient documentation

## 2019-09-30 MED ORDER — CLINDAMYCIN HCL 300 MG PO CAPS: 300.0000 mg | ORAL_CAPSULE | Freq: Three times a day (TID) | ORAL | 0 refills | Status: AC

## 2019-09-30 MED ORDER — CLINDAMYCIN PHOSPHATE 300 MG/2ML IJ SOLN
600.0000 mg | Freq: Once | INTRAMUSCULAR | Status: AC
  Administered 2019-09-30: 600 mg via INTRAMUSCULAR
  Filled 2019-09-30: qty 4

## 2019-09-30 MED ORDER — LIDOCAINE HCL (PF) 1 % IJ SOLN
5.0000 mL | Freq: Once | INTRAMUSCULAR | Status: AC
  Administered 2019-09-30: 5 mL via INTRADERMAL
  Filled 2019-09-30: qty 5

## 2019-09-30 MED ORDER — LIDOCAINE-EPINEPHRINE-TETRACAINE (LET) TOPICAL GEL
3.0000 mL | Freq: Once | TOPICAL | Status: AC
  Administered 2019-09-30: 19:00:00 3 mL via TOPICAL
  Filled 2019-09-30 (×2): qty 3

## 2019-09-30 NOTE — ED Triage Notes (Signed)
FIRST NURSE NOTE- here for spider bite. Has been on doxy X 2 days. No fever. Reports not getting better.

## 2019-09-30 NOTE — ED Triage Notes (Signed)
Pt presents to ED via POV with insect bite that she believes is a spider bite to R shin. Pt states she believes redness is worsening.

## 2019-09-30 NOTE — ED Notes (Addendum)
Pt has a spider bite on right calf that has redness and swelling around it. Pt marked redness around the bite a few days ago and the redness is beyond the line at this time.

## 2019-09-30 NOTE — ED Provider Notes (Signed)
[facility] Emergency Department Provider Note  ____________________________________________  Time seen: Approximately 5:13 PM  I have reviewed the triage vital signs and the nursing notes.   HISTORY  Chief Complaint Insect Bite    HPI Kathryn Valencia is a 34 y.o. female that presents to the emergency department for evaluation of rash to shin for 1 week. There are 2 punctures to the center, which she assumed was from a spider. She did not feel a bite.  She went to urgent care 2 days ago and was started on doxycycline. Redness has extended outside of the line that urgent care has drawn so she presented to the emergency department. She did not see any ticks. No fever, SOB, CP.    Past Medical History:  Diagnosis Date  . Abdominal pain   . Acoustic neuroma ([place]) 01/2015  . Acoustic neuroma ([place])   . Chronic headaches   . Depression   . Diarrhea, unspecified   . GERD (gastroesophageal reflux disease)   . Morbid obesity ([place])   . Non-intractable cyclical vomiting with nausea   . PONV (postoperative nausea and vomiting)   . UTI (urinary tract infection) 03/20/2017   DX IN ED ON 03-20-17 AND IS CURRENTLY TAKING BACTRIM X 3 DAYS BID    There are no problems to display for this patient.   Past Surgical History:  Procedure Laterality Date  . CHOLECYSTECTOMY N/A 03/25/2017   Procedure: LAPAROSCOPIC CHOLECYSTECTOMY;  Surgeon: Herbert Pun, MD;  Location: [facility] ORS;  Service: General;  Laterality: N/A;  . 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  . ESOPHAGOGASTRODUODENOSCOPY (EGD) WITH PROPOFOL N/A 08/17/2017   Procedure: ESOPHAGOGASTRODUODENOSCOPY (EGD) WITH PROPOFOL;  Surgeon: Manya Silvas, MD;  Location[facility] ENDOSCOPY;  Service: Endoscopy;  Laterality: N/A;    Prior to Admission medications   Medication Sig Start Date End Date Taking? Authorizing Provider  acetaminophen (TYLENOL) 500 MG tablet Take 1,000 mg by mouth every 6  (six) hours as needed for mild pain or moderate pain.    [provider]  cetirizine (ZYRTEC) 5 MG tablet Take 5 mg by mouth daily as needed for allergies.    [provider]  clindamycin (CLEOCIN) 300 MG capsule Take 1 capsule (300 mg total) by mouth 3 (three) times daily for 10 days. 09/30/19 10/10/19  Laban Emperor, PA-C  esomeprazole (NEXIUM) 20 MG capsule Take 20 mg by mouth every morning. Continuous     [provider]  fluticasone (FLONASE) 50 MCG/ACT nasal spray Place 2 sprays into both nostrils daily.    [provider]  ibuprofen (ADVIL) 600 MG tablet Take 1 tablet (600 mg total) by mouth every 8 (eight) hours as needed. 08/15/19   Sable Feil, PA-C  meloxicam (MOBIC) 15 MG tablet Take 1 tablet (15 mg total) by mouth daily. 08/05/19   Triplett, Dessa Phi, FNP  Multiple Vitamins-Minerals (MULTIVITAMIN WITH MINERALS) tablet Take 1 tablet by mouth daily.    [provider]  Norgestimate-Ethinyl Estradiol Triphasic 0.18/0.215/0.25 MG-25 MCG tab Take 1 tablet by mouth daily. 01/28/17   [provider]  ondansetron (ZOFRAN ODT) 4 MG disintegrating tablet Take 1 tablet (4 mg total) by mouth every 8 (eight) hours as needed for nausea or vomiting. 09/20/19   Duffy Bruce, MD  oxyCODONE-acetaminophen (PERCOCET) 7.5-325 MG tablet Take 1 tablet by mouth every 6 (six) hours as needed for severe pain. 08/20/19   Sable Feil, PA-C  sertraline (ZOLOFT) 25 MG tablet Take  25 mg by mouth every morning.  08/19/16   [provider]    Allergies Patient has no known allergies.  No family history on file.  Social History Social History   Tobacco Use  . Smoking status: Never Smoker  . Smokeless tobacco: Never Used  Substance Use Topics  . Alcohol use: No  . Drug use: No     Review of Systems  Constitutional: No fever/chills Cardiovascular: No chest pain. Respiratory: No SOB. Gastrointestinal: No abdominal pain.  No nausea, no  vomiting.  Musculoskeletal: Negative for musculoskeletal pain. Skin: Negative for abrasions, lacerations, ecchymosis. Positive for rash.  Neurological: Negative for numbness or tingling   ____________________________________________   PHYSICAL EXAM:  VITAL SIGNS: ED Triage Vitals  Enc Vitals Group     BP 09/30/19 1635 135/81     Pulse Rate 09/30/19 1635 65     Resp 09/30/19 1635 18     Temp 09/30/19 1635 98.5 F (36.9 C)     Temp Source 09/30/19 1635 Oral     SpO2 09/30/19 1635 100 %     Weight 09/30/19 1636 288 lb (130.6 kg)     Height 09/30/19 1636 5\' 7"  (1.702 m)     Head Circumference --      Peak Flow --      Pain Score 09/30/19 1636 6     Pain Loc --      Pain Edu? --      Excl. in [place]? --      Constitutional: Alert and oriented. Well appearing and in no acute distress. Eyes: Conjunctivae are normal. PERRL. EOMI. Head: Atraumatic. ENT:      Ears:      Nose: No congestion/rhinnorhea.      Mouth/Throat: Mucous membranes are moist.  Neck: No stridor.  Cardiovascular: Normal rate, regular rhythm.  Good peripheral circulation. Respiratory: Normal respiratory effort without tachypnea or retractions. Lungs CTAB. Good air entry to the bases with no decreased or absent breath sounds. Gastrointestinal: Bowel sounds 4 quadrants. Soft and nontender to palpation. No guarding or rigidity. No palpable masses. No distention. Musculoskeletal: Full range of motion to all extremities. No gross deformities appreciated. Neurologic:  Normal speech and language. No gross focal neurologic deficits are appreciated.   Skin:  Skin is warm, dry.  1 cm x 1 cm area of induration with central pustule and with 2 inches of surrounding erythema. Psychiatric: Mood and affect are normal. Speech and behavior are normal. Patient exhibits appropriate insight and judgement.   ____________________________________________   LABS (all labs ordered are listed, but only abnormal results are  displayed)  Labs Reviewed - No data to display ____________________________________________  EKG   ____________________________________________  RADIOLOGY   No results found.  ____________________________________________    PROCEDURES  Procedure(s) performed:    Procedures  INCISION AND DRAINAGE Performed by: Laban Emperor Consent: Verbal consent obtained. Risks and benefits: risks, benefits and alternatives were discussed Type: abscess  Body area: shin  Anesthesia: local infiltration  Incision was made with a scalpel.  Local anesthetic: LET  Anesthetic total: 3 ml  Complexity: complex  Drainage: purulent  Packing material: 1/4 in iodoform gauze  Patient tolerance: Patient tolerated the procedure well with no immediate complications.    Medications  lidocaine-EPINEPHrine-tetracaine (LET) topical gel (3 mLs Topical Given 09/30/19 1924)  lidocaine (PF) (XYLOCAINE) 1 % injection 5 mL (5 mLs Intradermal Given 09/30/19 1925)  clindamycin (CLEOCIN) injection 600 mg (600 mg Intramuscular Given 09/30/19 1921)     ____________________________________________  INITIAL IMPRESSION / ASSESSMENT AND PLAN / ED COURSE  Pertinent labs & imaging results that were available during my care of the patient were reviewed by me and considered in my medical decision making (see chart for details).  Review of the [place] CSRS was performed in accordance of the [place] prior to dispensing any controlled drugs.    Patient presented to the emergency department for evaluation of insect bite.  Vital signs and exam are reassuring.  Exam is consistent with cellulitis and possible abscess.  Patient has a small area of induration to the center of a larger area of cellulitis.  Risks and benefits of attempted incision and drainage were discussed with the patient and we elected to attempt incision and drainage.  Incision and drainage was successful with purulent drainage.  Area is small so was  not packed.  Patient will be discharged home with prescriptions for clindamycin. Patient is to follow up with primary care as directed. Patient is given ED precautions to return to the ED for any worsening or new symptoms.  Kathryn Valencia was evaluated in Emergency Department on 09/30/2019 for the symptoms described in the history of present illness. She was evaluated in the context of the global COVID-19 pandemic, which necessitated consideration that the patient might be at risk for infection with the SARS-CoV-2 virus that causes COVID-19. Institutional protocols and algorithms that pertain to the evaluation of patients at risk for COVID-19 are in a state of rapid change based on information released by regulatory bodies including the CDC and federal and state organizations. These policies and algorithms were followed during the patient's care in the ED.   ____________________________________________  FINAL CLINICAL IMPRESSION(S) / ED DIAGNOSES  Final diagnoses:  Insect bite of right lower leg, initial encounter  Abscess  Cellulitis of right lower extremity      NEW MEDICATIONS STARTED DURING THIS VISIT:  ED Discharge Orders         Ordered    clindamycin (CLEOCIN) 300 MG capsule  3 times daily     09/30/19 1937              This chart was dictated using voice recognition software/Dragon. Despite best efforts to proofread, errors can occur which can change the meaning. Any change was purely unintentional.    Laban Emperor, PA-C 09/30/19 2025    Merlyn Lot, MD 09/30/19 2102

## 2020-02-03 ENCOUNTER — Emergency Department
Admission: EM | Admit: 2020-02-03 | Discharge: 2020-02-03 | Disposition: A | Discharge status: Home / Self Care | Payer: Medicaid Other | Attending: Emergency Medicine | Admitting: Emergency Medicine

## 2020-02-03 ENCOUNTER — Other Ambulatory Visit: Payer: Self-pay

## 2020-02-03 ENCOUNTER — Encounter: Payer: Self-pay | Admitting: Emergency Medicine

## 2020-02-03 DIAGNOSIS — H00015 Hordeolum externum left lower eyelid: Secondary | ICD-10-CM | POA: Diagnosis not present

## 2020-02-03 DIAGNOSIS — H01005 Unspecified blepharitis left lower eyelid: Secondary | ICD-10-CM | POA: Diagnosis not present

## 2020-02-03 DIAGNOSIS — H5712 Ocular pain, left eye: Secondary | ICD-10-CM | POA: Diagnosis present

## 2020-02-03 DIAGNOSIS — Z79899 Other long term (current) drug therapy: Secondary | ICD-10-CM | POA: Diagnosis not present

## 2020-02-03 MED ORDER — KETOROLAC TROMETHAMINE 0.5 % OP SOLN: 1.0000 [drp] | Freq: Four times a day (QID) | OPHTHALMIC | 0 refills | Status: DC

## 2020-02-03 MED ORDER — CEPHALEXIN 500 MG PO CAPS: 500.0000 mg | ORAL_CAPSULE | Freq: Two times a day (BID) | ORAL | 0 refills | Status: AC

## 2020-02-03 MED ORDER — HYDROCODONE-ACETAMINOPHEN 5-325 MG PO TABS
1.0000 | ORAL_TABLET | Freq: Once | ORAL | Status: AC
  Administered 2020-02-03: 1 via ORAL
  Filled 2020-02-03: qty 1

## 2020-02-03 MED ORDER — CEPHALEXIN 500 MG PO CAPS
500.0000 mg | ORAL_CAPSULE | Freq: Once | ORAL | Status: AC
  Administered 2020-02-03: 500 mg via ORAL
  Filled 2020-02-03: qty 1

## 2020-02-03 NOTE — ED Notes (Signed)
Pt states that she recently got an ointment for her eye and that today it started swelling up and getting more red.

## 2020-02-03 NOTE — ED Triage Notes (Signed)
Patient with complaint of pain to left eye when blinking times two days. Patient states that she was seen at urgent care for the same and prescribed some cream. Patient states that the cream makes her eye burn.

## 2020-02-03 NOTE — Discharge Instructions (Signed)
Please follow up with the eye specialist if not improving over the next 2 days.  Return to the ER for symptoms that change or worsen if unable to schedule an appointment.

## 2020-02-03 NOTE — ED Provider Notes (Signed)
[facility] Emergency Department Provider Note ____________________________________________  Time seen: Approximately 10:05 PM  I have reviewed the triage vital signs and the nursing notes.   HISTORY  Chief Complaint Eye Pain   HPI Kathryn Valencia is a 34 y.o. female presenting to the emergency department for treatment and evaluation of left eye pain.  Pain increases with blinking.  She was evaluated at urgent care for the same and prescribed eye ointment.  Her eye lid has started to swell and is progressively getting more red today.   Past Medical History:  Diagnosis Date  . Abdominal pain   . Acoustic neuroma ([place]) 01/2015  . Acoustic neuroma ([place])   . Chronic headaches   . Depression   . Diarrhea, unspecified   . GERD (gastroesophageal reflux disease)   . Morbid obesity ([place])   . Non-intractable cyclical vomiting with nausea   . PONV (postoperative nausea and vomiting)   . UTI (urinary tract infection) 03/20/2017   DX IN ED ON 03-20-17 AND IS CURRENTLY TAKING BACTRIM X 3 DAYS BID    There are no problems to display for this patient.   Past Surgical History:  Procedure Laterality Date  . CHOLECYSTECTOMY N/A 03/25/2017   Procedure: LAPAROSCOPIC CHOLECYSTECTOMY;  Surgeon: Herbert Pun, MD;  Location: [facility] ORS;  Service: General;  Laterality: N/A;  . 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  . ESOPHAGOGASTRODUODENOSCOPY (EGD) WITH PROPOFOL N/A 08/17/2017   Procedure: ESOPHAGOGASTRODUODENOSCOPY (EGD) WITH PROPOFOL;  Surgeon: Manya Silvas, MD;  Location: [facility] ENDOSCOPY;  Service: Endoscopy;  Laterality: N/A;    Prior to Admission medications   Medication Sig Start Date End Date Taking? Authorizing Provider  acetaminophen (TYLENOL) 500 MG tablet Take 1,000 mg by mouth every 6 (six) hours as needed for mild pain or moderate pain.    [provider]  cephALEXin (KEFLEX) 500 MG capsule Take 1 capsule (500 mg total)  by mouth 2 (two) times daily for 7 days. 02/03/20 02/10/20  Gerturde Kuba, Johnette Abraham B, FNP  cetirizine (ZYRTEC) 5 MG tablet Take 5 mg by mouth daily as needed for allergies.    [provider]  esomeprazole (NEXIUM) 20 MG capsule Take 20 mg by mouth every morning. Continuous     [provider]  fluticasone (FLONASE) 50 MCG/ACT nasal spray Place 2 sprays into both nostrils daily.    [provider]  ibuprofen (ADVIL) 600 MG tablet Take 1 tablet (600 mg total) by mouth every 8 (eight) hours as needed. 08/15/19   Sable Feil, PA-C  ketorolac (ACULAR) 0.5 % ophthalmic solution Place 1 drop into the left eye 4 (four) times daily. 02/03/20   Bartt Gonzaga, Johnette Abraham B, FNP  meloxicam (MOBIC) 15 MG tablet Take 1 tablet (15 mg total) by mouth daily. 08/05/19   Abiel Antrim, Dessa Phi, FNP  Multiple Vitamins-Minerals (MULTIVITAMIN WITH MINERALS) tablet Take 1 tablet by mouth daily.    [provider]  Norgestimate-Ethinyl Estradiol Triphasic 0.18/0.215/0.25 MG-25 MCG tab Take 1 tablet by mouth daily. 01/28/17   [provider]  ondansetron (ZOFRAN ODT) 4 MG disintegrating tablet Take 1 tablet (4 mg total) by mouth every 8 (eight) hours as needed for nausea or vomiting. 09/20/19   Duffy Bruce, MD  oxyCODONE-acetaminophen (PERCOCET) 7.5-325 MG tablet Take 1 tablet by mouth every 6 (six) hours as needed for severe pain. 08/20/19   Sable Feil, PA-C  sertraline (ZOLOFT) 25 MG tablet Take 25 mg by mouth every morning.  08/19/16  [provider]    Allergies Patient has no known allergies.  No family history on file.  Social History Social History   Tobacco Use  . Smoking status: Never Smoker  . Smokeless tobacco: Never Used  Vaping Use  . Vaping Use: Never used  Substance Use Topics  . Alcohol use: No  . Drug use: No    Review of Systems   Constitutional: No fever/chills Eyes: Negative for visual changes. Positive for pain. Negative for drainage. Musculoskeletal:  Negative for pain. Skin: Negative for rash. Neurological: Negative for headaches, focal weakness or numbness. Allergic: Negative for seasonal allergies. ____________________________________________  PHYSICAL EXAM:  VITAL SIGNS: ED Triage Vitals  Enc Vitals Group     BP 02/03/20 2034 (!) 107/59     Pulse Rate 02/03/20 2033 64     Resp 02/03/20 2033 18     Temp 02/03/20 2033 98.6 F (37 C)     Temp Source 02/03/20 2033 Oral     SpO2 02/03/20 2033 98 %     Weight 02/03/20 2033 288 lb (130.6 kg)     Height 02/03/20 2033 5\' 7"  (1.702 m)     Head Circumference --      Peak Flow --      Pain Score 02/03/20 2033 9     Pain Loc --      Pain Edu? --      Excl. in [place]? --     Constitutional: Alert and oriented. Well appearing and in no acute distress. Eyes: Visual acuity--see nursing documentation; no globe trauma; Eyelids normal to inspection; Sclera appears anicteric.  Eyelids not inverted. Conjunctiva appears clear; Cornea normal. Left lower lid erythematous with pustule along lower eyelash. Head: Atraumatic. Nose: No congestion/rhinnorhea. Mouth/Throat: Mucous membranes are moist.  Oropharynx non-erythematous. Respiratory: Respirations even and unlabored. Breath sounds clear to auscultation. Musculoskeletal:Normal ROM x 4 extremities. Neurologic:  Normal speech and language. No gross focal neurologic deficits are appreciated. Speech is normal. No gait instability. Skin:  Skin is warm, dry and intact. No rash noted. Psychiatric: Mood and affect are normal. Speech and behavior are normal.  ____________________________________________   LABS (all labs ordered are listed, but only abnormal results are displayed)  Labs Reviewed - No data to display ____________________________________________  EKG  Not indicated ____________________________________________  RADIOLOGY  Not indicated ____________________________________________   PROCEDURES  Procedure(s) performed: Left  lead was anesthetized by tetracaine and a 21-gauge needle was inserted into the area of fluctuance on the external aspect of the lower lash at the lateral canthus with return of purulent fluid. ____________________________________________   INITIAL IMPRESSION / ASSESSMENT AND PLAN / ED COURSE  34 year old female presents to the emergency department for left lower eyelid swelling.  See HPI for further details.  Needle drainage performed of the small abscess.  See procedure for details.  She will be placed on a week of antibiotic and advised to continue using the eye ointment as prescribed.  She is to follow-up with ophthalmology if her symptoms are not improving over the next couple days.  Pertinent labs & imaging results that were available during my care of the patient were reviewed by me and considered in my medical decision making (see chart for details). ____________________________________________   FINAL CLINICAL IMPRESSION(S) / ED DIAGNOSES  Final diagnoses:  Hordeolum externum of left lower eyelid  Blepharitis of left lower eyelid, unspecified type    Note:  This document was prepared using Dragon voice recognition software and may include unintentional  dictation errors.    Victorino Dike, FNP 02/03/20 0502    Lavonia Drafts, MD 02/08/20 1301

## 2020-02-20 ENCOUNTER — Other Ambulatory Visit: Payer: Self-pay | Admitting: Acute Care

## 2020-02-20 DIAGNOSIS — R42 Dizziness and giddiness: Secondary | ICD-10-CM

## 2020-03-08 ENCOUNTER — Ambulatory Visit
Admission: RE | Admit: 2020-03-08 | Discharge: 2020-03-08 | Disposition: A | Discharge status: Home / Self Care | Payer: Medicaid Other | Source: Ambulatory Visit | Attending: Acute Care | Admitting: Acute Care

## 2020-03-08 ENCOUNTER — Other Ambulatory Visit: Payer: Self-pay

## 2020-03-08 DIAGNOSIS — R42 Dizziness and giddiness: Secondary | ICD-10-CM

## 2020-06-21 ENCOUNTER — Emergency Department
Admission: EM | Admit: 2020-06-21 | Discharge: 2020-06-21 | Disposition: A | Discharge status: Home / Self Care | Payer: Medicaid Other | Attending: Emergency Medicine | Admitting: Emergency Medicine

## 2020-06-21 ENCOUNTER — Other Ambulatory Visit: Payer: Self-pay

## 2020-06-21 ENCOUNTER — Emergency Department: Payer: Medicaid Other

## 2020-06-21 DIAGNOSIS — J189 Pneumonia, unspecified organism: Secondary | ICD-10-CM

## 2020-06-21 DIAGNOSIS — J1282 Pneumonia due to coronavirus disease 2019: Secondary | ICD-10-CM | POA: Insufficient documentation

## 2020-06-21 DIAGNOSIS — U071 COVID-19: Secondary | ICD-10-CM | POA: Insufficient documentation

## 2020-06-21 DIAGNOSIS — Z79899 Other long term (current) drug therapy: Secondary | ICD-10-CM | POA: Insufficient documentation

## 2020-06-21 DIAGNOSIS — R059 Cough, unspecified: Secondary | ICD-10-CM | POA: Diagnosis present

## 2020-06-21 LAB — SARS CORONAVIRUS 2 (TAT 6-24 HRS): SARS Coronavirus 2: POSITIVE — AB

## 2020-06-21 MED ORDER — METHYLPREDNISOLONE 4 MG PO TBPK: ORAL_TABLET | ORAL | 0 refills | Status: DC

## 2020-06-21 MED ORDER — AZITHROMYCIN 250 MG PO TABS: ORAL_TABLET | ORAL | 0 refills | Status: AC

## 2020-06-21 MED ORDER — PSEUDOEPH-BROMPHEN-DM 30-2-10 MG/5ML PO SYRP: 5.0000 mL | ORAL_SOLUTION | Freq: Four times a day (QID) | ORAL | 0 refills | Status: DC | PRN

## 2020-06-21 NOTE — ED Notes (Signed)
Pt verbalized understanding of d/c instructions at this time. Pt denies further questions at this time. Pt ambulatory to lobby at this time, steady gait noted, NAD noted, RR even and unlabored.

## 2020-06-21 NOTE — ED Triage Notes (Signed)
Pt comes into the ED via POV c/o cough, body aches, chills, and headache.  Pt states her symptoms started 4 days ago with just a  Headache.  Denies that she has been in contact with anyone who is known COVID positive.  Pt currently has even and unlabored respirations, ambulatory to triage, and in NAD>

## 2020-06-21 NOTE — Discharge Instructions (Addendum)
Follow discharge care instruction take medication as directed.  Advised to self quarantine pending results of COVID-19 test.  Test is positive quarantine additional 10 days.

## 2020-06-21 NOTE — ED Provider Notes (Signed)
[facility] Emergency Department Provider Note   ____________________________________________   Event Date/Time   First MD Initiated Contact with Patient 06/21/20 [phone]     (approximate)  I have reviewed the triage vital signs and the nursing notes.   HISTORY  Chief Complaint Cough, Generalized Body Aches, and Chills    HPI Kathryn Valencia is a 35 y.o. female patient presents with 4 days of cough, body aches, chills, and headache.  Patient also stated nausea vomiting.  Patient state positive contact with COVID-19.  Patient did not take vaccine or flu shot.  Rates her pain/discomfort as 8/10.  Described pain/discomfort as achy".  No palliative measure for complaint.      Past Medical History:  Diagnosis Date  . Abdominal pain   . Acoustic neuroma ([facility]) 01/2015  . Acoustic neuroma ([facility])   . Chronic headaches   . Depression   . Diarrhea, unspecified   . GERD (gastroesophageal reflux disease)   . Morbid obesity ([facility])   . Non-intractable cyclical vomiting with nausea   . PONV (postoperative nausea and vomiting)   . UTI (urinary tract infection) 03/20/2017   DX IN ED ON 03-20-17 AND IS CURRENTLY TAKING BACTRIM X 3 DAYS BID    There are no problems to display for this patient.   Past Surgical History:  Procedure Laterality Date  . CHOLECYSTECTOMY N/A 03/25/2017   Procedure: LAPAROSCOPIC CHOLECYSTECTOMY;  Surgeon: Carolan Shiver, MD;  Location: [facility] ORS;  Service: General;  Laterality: N/A;  . 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  . ESOPHAGOGASTRODUODENOSCOPY (EGD) WITH PROPOFOL N/A 08/17/2017   Procedure: ESOPHAGOGASTRODUODENOSCOPY (EGD) WITH PROPOFOL;  Surgeon: Scot Jun, MD;  Location: [facility] ENDOSCOPY;  Service: Endoscopy;  Laterality: N/A;    Prior to Admission medications   Medication Sig Start Date End Date Taking? Authorizing Provider  azithromycin (ZITHROMAX Z-PAK) 250 MG tablet Take 2 tablets (500 mg)  on  Day 1,  followed by 1 tablet (250 mg) once daily on Days 2 through 5. 06/21/20 06/26/20 Yes Joni Reining, PA-C  brompheniramine-pseudoephedrine-DM 30-2-10 MG/5ML syrup Take 5 mLs by mouth 4 (four) times daily as needed. 06/21/20  Yes Joni Reining, PA-C  methylPREDNISolone (MEDROL DOSEPAK) 4 MG TBPK tablet Take Tapered dose as directed 06/21/20  Yes Joni Reining, PA-C  acetaminophen (TYLENOL) 500 MG tablet Take 1,000 mg by mouth every 6 (six) hours as needed for mild pain or moderate pain.    [provider]  cetirizine (ZYRTEC) 5 MG tablet Take 5 mg by mouth daily as needed for allergies.    [provider]  esomeprazole (NEXIUM) 20 MG capsule Take 20 mg by mouth every morning. Continuous     [provider]  fluticasone (FLONASE) 50 MCG/ACT nasal spray Place 2 sprays into both nostrils daily.    [provider]  ibuprofen (ADVIL) 600 MG tablet Take 1 tablet (600 mg total) by mouth every 8 (eight) hours as needed. 08/15/19   Joni Reining, PA-C  ketorolac (ACULAR) 0.5 % ophthalmic solution Place 1 drop into the left eye 4 (four) times daily. 02/03/20   Triplett, Rulon Eisenmenger B, FNP  meloxicam (MOBIC) 15 MG tablet Take 1 tablet (15 mg total) by mouth daily. 08/05/19   Triplett, Kasandra Knudsen, FNP  Multiple Vitamins-Minerals (MULTIVITAMIN WITH MINERALS) tablet Take 1 tablet by mouth daily.    [provider]  Norgestimate-Ethinyl Estradiol Triphasic 0.18/0.215/0.25 MG-25 MCG tab Take 1 tablet by mouth daily.  01/28/17   [provider]  ondansetron (ZOFRAN ODT) 4 MG disintegrating tablet Take 1 tablet (4 mg total) by mouth every 8 (eight) hours as needed for nausea or vomiting. 09/20/19   Duffy Bruce, MD  oxyCODONE-acetaminophen (PERCOCET) 7.5-325 MG tablet Take 1 tablet by mouth every 6 (six) hours as needed for severe pain. 08/20/19   Sable Feil, PA-C  sertraline (ZOLOFT) 25 MG tablet Take 25 mg by mouth every morning.  08/19/16   [provider]    Allergies Patient has no known allergies.  No family history on file.  Social History Social History   Tobacco Use  . Smoking status: Never Smoker  . Smokeless tobacco: Never Used  Vaping Use  . Vaping Use: Never used  Substance Use Topics  . Alcohol use: No  . Drug use: No    Review of Systems Constitutional: Fever/chills and body aches. Eyes: No visual changes. ENT: Sore throat. Cardiovascular: Denies chest pain. Respiratory: Denies shortness of breath.  Nonproductive cough. Gastrointestinal: No abdominal pain.  No nausea, no vomiting.  No diarrhea.  No constipation. Genitourinary: Negative for dysuria. Musculoskeletal: Negative for back pain. Skin: Negative for rash. Neurological: Negative for headaches, but denies focal weakness or numbness.   ____________________________________________   PHYSICAL EXAM:  VITAL SIGNS: ED Triage Vitals [06/21/20 0908]  Enc Vitals Group     BP 109/70     Pulse Rate 80     Resp 18     Temp 99.8 F (37.7 C)     Temp Source Oral     SpO2 99 %     Weight 288 lb (130.6 kg)     Height 5\' 7"  (1.702 m)     Head Circumference      Peak Flow      Pain Score 8     Pain Loc      Pain Edu?      Excl. in [place]?    Constitutional: Alert and oriented. Well appearing and in no acute distress. Eyes: Conjunctivae are normal. PERRL. EOMI. Head: Atraumatic. Nose: No congestion/rhinnorhea. Mouth/Throat: Mucous membranes are moist.  Oropharynx non-erythematous. Neck: No stridor.  Hematological/Lymphatic/Immunilogical: No cervical lymphadenopathy. Cardiovascular: Normal rate, regular rhythm. Grossly normal heart sounds.  Good peripheral circulation. Respiratory: Normal respiratory effort.  No retractions. Lungs CTAB. Gastrointestinal: Soft and nontender. No distention. No abdominal bruits. No CVA tenderness. Genitourinary: Deferred Musculoskeletal: No lower extremity tenderness nor edema.  No joint effusions. Neurologic:  Normal  speech and language. No gross focal neurologic deficits are appreciated. No gait instability. Skin:  Skin is warm, dry and intact. No rash noted. Psychiatric: Mood and affect are normal. Speech and behavior are normal.  ____________________________________________   LABS (all labs ordered are listed, but only abnormal results are displayed)  Labs Reviewed  SARS CORONAVIRUS 2 (TAT 6-24 HRS)   ____________________________________________  EKG   ____________________________________________  RADIOLOGY I, Sable Feil, personally viewed and evaluated these images (plain radiographs) as part of my medical decision making, as well as reviewing the written report by the radiologist.  ED MD interpretation:    Official radiology report(s): DG Chest Portable 1 View  Result Date: 06/21/2020 CLINICAL DATA:  Fever.  Cough.  COVID exposure. EXAM: PORTABLE CHEST 1 VIEW COMPARISON:  07/26/2019. FINDINGS: Heart size stable. Mild bilateral interstitial prominence. Mild interstitial edema and/or pneumonitis could present this fashion. No pleural effusion or pneumothorax. IMPRESSION: Mild bilateral interstitial prominence. Mild interstitial edema and/or pneumonitis could present and this fashion. Electronically  Signed   By: Marcello Moores  Register   On: 06/21/2020 09:49    ____________________________________________   PROCEDURES  Procedure(s) performed (including Critical Care):  Procedures   ____________________________________________   INITIAL IMPRESSION / ASSESSMENT AND PLAN / ED COURSE  As part of my medical decision making, I reviewed the following data within the [place]         Patient presents with cough, body aches, chills, headache for 4 days.  Was exposed to COVID-19.  Discussed x-ray findings with patient.  Patient given discharge care instruction advised self quarantine pending results of COVID-19.  If test is positive advised to quarantine additional 10  days.      ____________________________________________   FINAL CLINICAL IMPRESSION(S) / ED DIAGNOSES  Final diagnoses:  Pneumonitis     ED Discharge Orders         Ordered    azithromycin (ZITHROMAX Z-PAK) 250 MG tablet        06/21/20 1148    methylPREDNISolone (MEDROL DOSEPAK) 4 MG TBPK tablet        06/21/20 1148    brompheniramine-pseudoephedrine-DM 30-2-10 MG/5ML syrup  4 times daily PRN        06/21/20 1148          *Please note:  Darrion Macaulay was evaluated in Emergency Department on 06/21/2020 for the symptoms described in the history of present illness. She was evaluated in the context of the global COVID-19 pandemic, which necessitated consideration that the patient might be at risk for infection with the SARS-CoV-2 virus that causes COVID-19. Institutional protocols and algorithms that pertain to the evaluation of patients at risk for COVID-19 are in a state of rapid change based on information released by regulatory bodies including the CDC and federal and state organizations. These policies and algorithms were followed during the patient's care in the ED.  Some ED evaluations and interventions may be delayed as a result of limited staffing during and the pandemic.*   Note:  This document was prepared using Dragon voice recognition software and may include unintentional dictation errors.    Sable Feil, PA-C 06/21/20 1152    Blake Divine, MD 06/22/20 [phone]

## 2020-06-22 ENCOUNTER — Encounter (HOSPITAL_COMMUNITY): Payer: Self-pay

## 2020-06-22 ENCOUNTER — Telehealth (HOSPITAL_COMMUNITY): Payer: Self-pay | Admitting: Internal Medicine

## 2020-06-22 ENCOUNTER — Other Ambulatory Visit (HOSPITAL_COMMUNITY): Payer: Self-pay | Admitting: Internal Medicine

## 2020-06-22 DIAGNOSIS — U071 COVID-19: Secondary | ICD-10-CM

## 2020-06-22 NOTE — Telephone Encounter (Signed)
Called to discuss with patient about COVID-19 symptoms and the use of one of the available treatments for those with mild to moderate Covid symptoms and at a high risk of hospitalization.  Pt appears to qualify for outpatient treatment due to co-morbid conditions and/or a member of an at-risk group in accordance with the FDA Emergency Use Authorization.    Symptom onset: 1/9 Vaccinated: no Booster? no Immunocompromised? no Qualifiers: SVI                   BMI 45  Spoke c pt and her father. They wish to consider option. MyChart information sent with number to call if they wish to proceed with IV MAB. She would need to be infused no later than 1/15. Pt and family aware.   Alan Ripper, [place]

## 2020-06-22 NOTE — Progress Notes (Signed)
I connected by phone with Kathryn Valencia on 06/22/2020 at 8:05 PM to discuss the potential use of a new treatment for mild to moderate COVID-19 viral infection in non-hospitalized patients.  This patient is a 35 y.o. female that meets the FDA criteria for Emergency Use Authorization of COVID monoclonal antibody sotrovimab.  Has a (+) direct SARS-CoV-2 viral test result  Has mild or moderate COVID-19   Is NOT hospitalized due to COVID-19  Is within 10 days of symptom onset  Has at least one of the high risk factor(s) for progression to severe COVID-19 and/or hospitalization as defined in EUA.  Specific high risk criteria : BMI > 25   I have spoken and communicated the following to the patient or parent/caregiver regarding COVID monoclonal antibody treatment:  1. FDA has authorized the emergency use for the treatment of mild to moderate COVID-19 in adults and pediatric patients with positive results of direct SARS-CoV-2 viral testing who are 27 years of age and older weighing at least 40 kg, and who are at high risk for progressing to severe COVID-19 and/or hospitalization.  2. The significant known and potential risks and benefits of COVID monoclonal antibody, and the extent to which such potential risks and benefits are unknown.  3. Information on available alternative treatments and the risks and benefits of those alternatives, including clinical trials.  4. Patients treated with COVID monoclonal antibody should continue to self-isolate and use infection control measures (e.g., wear mask, isolate, social distance, avoid sharing personal items, clean and disinfect "high touch" surfaces, and frequent handwashing) according to CDC guidelines.   5. The patient or parent/caregiver has the option to accept or refuse COVID monoclonal antibody treatment.  After reviewing this information with the patient, the patient has agreed to receive one of the available covid 19 monoclonal antibodies  and will be provided an appropriate fact sheet prior to infusion.  Alan Ripper, [place]

## 2020-06-23 ENCOUNTER — Ambulatory Visit (HOSPITAL_COMMUNITY): Payer: Medicaid Other | Attending: Pulmonary Disease

## 2020-06-23 ENCOUNTER — Ambulatory Visit (HOSPITAL_COMMUNITY): Payer: Medicaid Other

## 2020-07-24 ENCOUNTER — Emergency Department: Payer: Medicaid Other

## 2020-07-24 ENCOUNTER — Other Ambulatory Visit: Payer: Self-pay

## 2020-07-24 ENCOUNTER — Encounter: Payer: Self-pay | Admitting: Emergency Medicine

## 2020-07-24 ENCOUNTER — Emergency Department
Admission: EM | Admit: 2020-07-24 | Discharge: 2020-07-24 | Disposition: A | Discharge status: Home / Self Care | Payer: Medicaid Other | Attending: Emergency Medicine | Admitting: Emergency Medicine

## 2020-07-24 DIAGNOSIS — M5431 Sciatica, right side: Secondary | ICD-10-CM

## 2020-07-24 DIAGNOSIS — M5441 Lumbago with sciatica, right side: Secondary | ICD-10-CM | POA: Diagnosis present

## 2020-07-24 DIAGNOSIS — M25551 Pain in right hip: Secondary | ICD-10-CM | POA: Insufficient documentation

## 2020-07-24 DIAGNOSIS — N390 Urinary tract infection, site not specified: Secondary | ICD-10-CM | POA: Insufficient documentation

## 2020-07-24 LAB — URINALYSIS, COMPLETE (UACMP) WITH MICROSCOPIC
Bilirubin Urine: NEGATIVE
Glucose, UA: NEGATIVE mg/dL
Ketones, ur: NEGATIVE mg/dL
Nitrite: NEGATIVE
Protein, ur: NEGATIVE mg/dL
Specific Gravity, Urine: 1.027 (ref 1.005–1.030)
pH: 5 (ref 5.0–8.0)

## 2020-07-24 LAB — POC URINE PREG, ED: Preg Test, Ur: NEGATIVE

## 2020-07-24 MED ORDER — NITROFURANTOIN MONOHYD MACRO 100 MG PO CAPS: 100.0000 mg | ORAL_CAPSULE | Freq: Two times a day (BID) | ORAL | 0 refills | Status: AC

## 2020-07-24 MED ORDER — LIDOCAINE 5 % EX PTCH
1.0000 | MEDICATED_PATCH | CUTANEOUS | Status: DC
  Administered 2020-07-24: 1 via TRANSDERMAL
  Filled 2020-07-24: qty 1

## 2020-07-24 MED ORDER — LIDOCAINE 5 % EX PTCH: 1.0000 | MEDICATED_PATCH | Freq: Two times a day (BID) | CUTANEOUS | 0 refills | Status: DC

## 2020-07-24 MED ORDER — METHYLPREDNISOLONE 4 MG PO TBPK: ORAL_TABLET | ORAL | 0 refills | Status: DC

## 2020-07-24 MED ORDER — CYCLOBENZAPRINE HCL 10 MG PO TABS
10.0000 mg | ORAL_TABLET | Freq: Once | ORAL | Status: AC
  Administered 2020-07-24: 10 mg via ORAL
  Filled 2020-07-24: qty 1

## 2020-07-24 MED ORDER — ORPHENADRINE CITRATE ER 100 MG PO TB12: 100.0000 mg | ORAL_TABLET | Freq: Two times a day (BID) | ORAL | 0 refills | Status: DC

## 2020-07-24 NOTE — ED Provider Notes (Signed)
[facility] Emergency Department Provider Note   ____________________________________________   Event Date/Time   First MD Initiated Contact with Patient 07/24/20 [phone]     (approximate)  I have reviewed the triage vital signs and the nursing notes.   HISTORY  Chief Complaint Back Pain    HPI Kathryn Valencia is a 35 y.o. female patient with low back pain with radicular component to the right lower extremity. The pain is intermittent numbness to the right side. Denies bladder or bowel dysfunction. Patient had history of fall landing on the right hip 6 months ago. Patient did not seek medical care for that complaint. Patient states taking Tylenol and ibuprofen with mild transient relief. Patient the back pain started approximately 2 weeks ago but is worse in the past few days. Patient denies provocative for back pain. Rates pain as 8/10. No other palliative measure for complaint.      Past Medical History:  Diagnosis Date  . Abdominal pain   . Acoustic neuroma ([place]) 01/2015  . Acoustic neuroma ([place])   . Chronic headaches   . Depression   . Diarrhea, unspecified   . GERD (gastroesophageal reflux disease)   . Morbid obesity ([place])   . Non-intractable cyclical vomiting with nausea   . PONV (postoperative nausea and vomiting)   . UTI (urinary tract infection) 03/20/2017   DX IN ED ON 03-20-17 AND IS CURRENTLY TAKING BACTRIM X 3 DAYS BID    There are no problems to display for this patient.   Past Surgical History:  Procedure Laterality Date  . CHOLECYSTECTOMY N/A 03/25/2017   Procedure: LAPAROSCOPIC CHOLECYSTECTOMY;  Surgeon: Herbert Pun, MD;  Location: [facility] ORS;  Service: General;  Laterality: N/A;  . 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  . ESOPHAGOGASTRODUODENOSCOPY (EGD) WITH PROPOFOL N/A 08/17/2017   Procedure: ESOPHAGOGASTRODUODENOSCOPY (EGD) WITH PROPOFOL;  Surgeon: Manya Silvas, MD;  Location: [facility]  ENDOSCOPY;  Service: Endoscopy;  Laterality: N/A;    Prior to Admission medications   Medication Sig Start Date End Date Taking? Authorizing Provider  lidocaine (LIDODERM) 5 % Place 1 patch onto the skin every 12 (twelve) hours. Remove & Discard patch within 12 hours or as directed by MD 07/24/20 07/24/21 Yes Sable Feil, PA-C  methylPREDNISolone (MEDROL DOSEPAK) 4 MG TBPK tablet Take Tapered dose as directed 07/24/20  Yes Sable Feil, PA-C  nitrofurantoin, macrocrystal-monohydrate, (MACROBID) 100 MG capsule Take 1 capsule (100 mg total) by mouth 2 (two) times daily for 7 days. 07/24/20 07/31/20 Yes Sable Feil, PA-C  orphenadrine (NORFLEX) 100 MG tablet Take 1 tablet (100 mg total) by mouth 2 (two) times daily. 07/24/20  Yes Sable Feil, PA-C  acetaminophen (TYLENOL) 500 MG tablet Take 1,000 mg by mouth every 6 (six) hours as needed for mild pain or moderate pain.    [provider]  brompheniramine-pseudoephedrine-DM 30-2-10 MG/5ML syrup Take 5 mLs by mouth 4 (four) times daily as needed. 06/21/20   Sable Feil, PA-C  cetirizine (ZYRTEC) 5 MG tablet Take 5 mg by mouth daily as needed for allergies.    [provider]  esomeprazole (NEXIUM) 20 MG capsule Take 20 mg by mouth every morning. Continuous     [provider]  fluticasone (FLONASE) 50 MCG/ACT nasal spray Place 2 sprays into both nostrils daily.    [provider]  ibuprofen (ADVIL) 600 MG tablet Take 1 tablet (600 mg total) by mouth every 8 (eight) hours as  needed. 08/15/19   Sable Feil, PA-C  ketorolac (ACULAR) 0.5 % ophthalmic solution Place 1 drop into the left eye 4 (four) times daily. 02/03/20   Triplett, Johnette Abraham B, FNP  meloxicam (MOBIC) 15 MG tablet Take 1 tablet (15 mg total) by mouth daily. 08/05/19   Sherrie George B, FNP  methylPREDNISolone (MEDROL DOSEPAK) 4 MG TBPK tablet Take Tapered dose as directed 06/21/20   Sable Feil, PA-C  Multiple Vitamins-Minerals (MULTIVITAMIN WITH  MINERALS) tablet Take 1 tablet by mouth daily.    [provider]  Norgestimate-Ethinyl Estradiol Triphasic 0.18/0.215/0.25 MG-25 MCG tab Take 1 tablet by mouth daily. 01/28/17   [provider]  sertraline (ZOLOFT) 25 MG tablet Take 25 mg by mouth every morning.  08/19/16   [provider]    Allergies Patient has no known allergies.  No family history on file.  Social History Social History   Tobacco Use  . Smoking status: Never Smoker  . Smokeless tobacco: Never Used  Vaping Use  . Vaping Use: Never used  Substance Use Topics  . Alcohol use: No  . Drug use: No    Review of Systems Constitutional: No fever/chills Eyes: No visual changes. ENT: No sore throat. Cardiovascular: Denies chest pain. Respiratory: Denies shortness of breath. Gastrointestinal: No abdominal pain.  No nausea, no vomiting.  No diarrhea.  No constipation. Genitourinary: Negative for dysuria. Musculoskeletal: Back and right hip pain. Skin: Negative for rash. Neurological: Negative for headaches, focal weakness or numbness. Psychiatric:  Depression     ____________________________________________   PHYSICAL EXAM:  VITAL SIGNS: ED Triage Vitals  Enc Vitals Group     BP 07/24/20 0828 108/72     Pulse Rate 07/24/20 0828 67     Resp 07/24/20 0828 18     Temp 07/24/20 0828 98 F (36.7 C)     Temp Source 07/24/20 0828 Oral     SpO2 07/24/20 0828 98 %     Weight 07/24/20 0830 288 lb (130.6 kg)     Height 07/24/20 0840 5\' 7"  (1.702 m)     Head Circumference --      Peak Flow --      Pain Score 07/24/20 0829 8     Pain Loc --      Pain Edu? --      Excl. in [place]? --     Constitutional: Alert and oriented. Well appearing and in no acute distress. Eyes: Conjunctivae are normal. PERRL. EOMI. Head: Atraumatic. Nose: No congestion/rhinnorhea. Mouth/Throat: Mucous membranes are moist.  Oropharynx non-erythematous. Neck: No stridor.  No cervical spine tenderness to  palpation. Hematological/Lymphatic/Immunilogical: No cervical lymphadenopathy. Cardiovascular: Normal rate, regular rhythm. Grossly normal heart sounds.  Good peripheral circulation. Respiratory: Normal respiratory effort.  No retractions. Lungs CTAB. Gastrointestinal: Soft and nontender. No distention. No abdominal bruits. No CVA tenderness. Genitourinary: Deferred Musculoskeletal: No obvious deformity to the lumbar spine.  Patient has full equal range of motion.  No leg length discrepancy.  Patient full and equal range of motion of the right lower extremity. Neurologic:  Normal speech and language. No gross focal neurologic deficits are appreciated. No gait instability. Skin:  Skin is warm, dry and intact. No rash noted.  No abrasion or ecchymosis. Psychiatric: Mood and affect are normal. Speech and behavior are normal.  ____________________________________________   LABS (all labs ordered are listed, but only abnormal results are displayed)  Labs Reviewed  URINALYSIS, COMPLETE (UACMP) WITH MICROSCOPIC - Abnormal; Notable for the following components:  Result Value   Color, Urine YELLOW (*)    APPearance CLOUDY (*)    Hgb urine dipstick SMALL (*)    Leukocytes,Ua MODERATE (*)    Bacteria, UA FEW (*)    All other components within normal limits  URINE CULTURE  POC URINE PREG, ED   ____________________________________________  EKG   ____________________________________________  RADIOLOGY I, Sable Feil, personally viewed and evaluated these images (plain radiographs) as part of my medical decision making, as well as reviewing the written report by the radiologist.  ED MD interpretation: No acute findings x-ray of the lumbar spine and right hip.  Official radiology report(s): DG Lumbar Spine Complete  Result Date: 07/24/2020 CLINICAL DATA:  Radicular back pain on the right EXAM: LUMBAR SPINE - COMPLETE 4+ VIEW COMPARISON:  None. FINDINGS: Assuming accessory rib on the  right at L1, 5 lumbar type vertebrae. No degenerative changes, fracture, erosive features. Cholecystectomy clips. IMPRESSION: Negative lumbar spine. Electronically Signed   By: Monte Fantasia M.D.   On: 07/24/2020 09:30   DG Hip Unilat W or Wo Pelvis 2-3 Views Right  Result Date: 07/24/2020 CLINICAL DATA:  Radicular back pain. EXAM: DG HIP (WITH OR WITHOUT PELVIS) 2-3V RIGHT COMPARISON:  CT 03/26/2017. FINDINGS: No acute bony or joint abnormality. No evidence of fracture or dislocation. Pelvic calcifications consistent phleboliths. IMPRESSION: No acute abnormality. Electronically Signed   By: Marcello Moores  Register   On: 07/24/2020 09:33    ____________________________________________   PROCEDURES  Procedure(s) performed (including Critical Care):  Procedures   ____________________________________________   INITIAL IMPRESSION / ASSESSMENT AND PLAN / ED COURSE  As part of my medical decision making, I reviewed the following data within the [place]         Patient presents with low back and right hip pain.  Discussed no acute findings x-ray of the lumbar spine and hip.  Patient complaint physical exam is consistent with sciatica.  Incidental finding of urinary tract infection.  Patient given discharge care instruction.  Patient advised take medication as directed follow-up PCP if no improvement in 1 week.      ____________________________________________   FINAL CLINICAL IMPRESSION(S) / ED DIAGNOSES  Final diagnoses:  Sciatica of right side  Lower urinary tract infectious disease     ED Discharge Orders         Ordered    lidocaine (LIDODERM) 5 %  Every 12 hours        07/24/20 0950    orphenadrine (NORFLEX) 100 MG tablet  2 times daily        07/24/20 0950    methylPREDNISolone (MEDROL DOSEPAK) 4 MG TBPK tablet        07/24/20 0950    nitrofurantoin, macrocrystal-monohydrate, (MACROBID) 100 MG capsule  2 times daily        07/24/20 9892           *Please note:  Twanisha Foulk was evaluated in Emergency Department on 07/24/2020 for the symptoms described in the history of present illness. She was evaluated in the context of the global COVID-19 pandemic, which necessitated consideration that the patient might be at risk for infection with the SARS-CoV-2 virus that causes COVID-19. Institutional protocols and algorithms that pertain to the evaluation of patients at risk for COVID-19 are in a state of rapid change based on information released by regulatory bodies including the CDC and federal and state organizations. These policies and algorithms were followed during the patient's care in the ED.  Some ED evaluations and interventions may be delayed as a result of limited staffing during and the pandemic.*   Note:  This document was prepared using Dragon voice recognition software and may include unintentional dictation errors.    Sable Feil, PA-C 07/24/20 7782    Nance Pear, MD 07/24/20 1054

## 2020-07-24 NOTE — ED Notes (Signed)
See triage note.  States she fell about 6 months ago  States she had some pain to lower back and right leg  States she noticed bruising at the time  But rested   States bruising went away   conts to have lower back pain which is anterior right upper leg

## 2020-07-24 NOTE — ED Triage Notes (Signed)
Pt in with low back pain, worsening x a few days. States she fell 6 mo's ago, and never fully felt right since then. States some periodic numbness to R thigh, no changes to gait or bowel/bladder control.

## 2020-07-24 NOTE — Discharge Instructions (Addendum)
No acute findings on right hip and lumbar spine x-ray.  Follow discharge care instruction take medication as directed.

## 2020-07-26 LAB — URINE CULTURE: Culture: >=100000 — AB

## 2020-10-02 ENCOUNTER — Other Ambulatory Visit: Payer: Self-pay

## 2020-10-02 ENCOUNTER — Emergency Department
Admission: EM | Admit: 2020-10-02 | Discharge: 2020-10-02 | Disposition: A | Discharge status: Home / Self Care | Payer: Medicaid Other | Attending: Emergency Medicine | Admitting: Emergency Medicine

## 2020-10-02 ENCOUNTER — Encounter: Payer: Self-pay | Admitting: Emergency Medicine

## 2020-10-02 DIAGNOSIS — N611 Abscess of the breast and nipple: Secondary | ICD-10-CM | POA: Insufficient documentation

## 2020-10-02 DIAGNOSIS — L0291 Cutaneous abscess, unspecified: Secondary | ICD-10-CM

## 2020-10-02 DIAGNOSIS — N6459 Other signs and symptoms in breast: Secondary | ICD-10-CM | POA: Diagnosis present

## 2020-10-02 MED ORDER — CEPHALEXIN 500 MG PO CAPS: 500.0000 mg | ORAL_CAPSULE | Freq: Three times a day (TID) | ORAL | 0 refills | Status: DC

## 2020-10-02 NOTE — ED Notes (Signed)
See triage note  Presents with a small red area to left breast  States she just noticed area this am  No itching

## 2020-10-02 NOTE — ED Triage Notes (Signed)
Pt c/o red render area to the left breast for the past 3 days

## 2020-10-02 NOTE — ED Provider Notes (Signed)
[facility] Emergency Department Provider Note  ____________________________________________   Event Date/Time   First MD Initiated Contact with Patient 10/02/20 [phone]     (approximate)  I have reviewed the triage vital signs and the nursing notes.   HISTORY  Chief Complaint Abscess    HPI Kathryn Valencia is a 35 y.o. female complains of a questionable abscess to the left breast.  Patient states that it started as a small rash and she thought it was ringworm.  States this become larger and is more tender.  No fever or chills.  No drainage from the area.  Symptoms for 3 days    Past Medical History:  Diagnosis Date  . Abdominal pain   . Acoustic neuroma ([place]) 01/2015  . Acoustic neuroma ([place])   . Chronic headaches   . Depression   . Diarrhea, unspecified   . GERD (gastroesophageal reflux disease)   . Morbid obesity ([place])   . Non-intractable cyclical vomiting with nausea   . PONV (postoperative nausea and vomiting)   . UTI (urinary tract infection) 03/20/2017   DX IN ED ON 03-20-17 AND IS CURRENTLY TAKING BACTRIM X 3 DAYS BID    There are no problems to display for this patient.   Past Surgical History:  Procedure Laterality Date  . CHOLECYSTECTOMY N/A 03/25/2017   Procedure: LAPAROSCOPIC CHOLECYSTECTOMY;  Surgeon: Herbert Pun, MD;  Location: [facility] ORS;  Service: General;  Laterality: N/A;  . 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  . ESOPHAGOGASTRODUODENOSCOPY (EGD) WITH PROPOFOL N/A 08/17/2017   Procedure: ESOPHAGOGASTRODUODENOSCOPY (EGD) WITH PROPOFOL;  Surgeon: Manya Silvas, MD;  Location: [facility] ENDOSCOPY;  Service: Endoscopy;  Laterality: N/A;    Prior to Admission medications   Medication Sig Start Date End Date Taking? Authorizing Provider  cephALEXin (KEFLEX) 500 MG capsule Take 1 capsule (500 mg total) by mouth 3 (three) times daily. 10/02/20  Yes Ova Meegan, Linden Dolin, PA-C  acetaminophen (TYLENOL) 500 MG  tablet Take 1,000 mg by mouth every 6 (six) hours as needed for mild pain or moderate pain.    [provider]  cetirizine (ZYRTEC) 5 MG tablet Take 5 mg by mouth daily as needed for allergies.    [provider]  fluticasone (FLONASE) 50 MCG/ACT nasal spray Place 2 sprays into both nostrils daily.    [provider]  Multiple Vitamins-Minerals (MULTIVITAMIN WITH MINERALS) tablet Take 1 tablet by mouth daily.    [provider]  Norgestimate-Ethinyl Estradiol Triphasic 0.18/0.215/0.25 MG-25 MCG tab Take 1 tablet by mouth daily. 01/28/17   [provider]  sertraline (ZOLOFT) 25 MG tablet Take 25 mg by mouth every morning.  08/19/16   [provider]  esomeprazole (NEXIUM) 20 MG capsule Take 20 mg by mouth every morning. Continuous   10/02/20  [provider]    Allergies Patient has no known allergies.  No family history on file.  Social History Social History   Tobacco Use  . Smoking status: Never Smoker  . Smokeless tobacco: Never Used  Vaping Use  . Vaping Use: Never used  Substance Use Topics  . Alcohol use: No  . Drug use: No    Review of Systems  Constitutional: No fever/chills Eyes: No visual changes. ENT: No sore throat. Respiratory: Denies cough Cardiovascular: Denies chest pain Gastrointestinal: Denies abdominal pain Genitourinary: Negative for dysuria. Musculoskeletal: Negative for back pain. Skin: Negative for rash.  Positive for tender area on left breast Psychiatric: no mood  changes,     ____________________________________________   PHYSICAL EXAM:  VITAL SIGNS: ED Triage Vitals  Enc Vitals Group     BP 10/02/20 0811 101/72     Pulse Rate 10/02/20 0811 67     Resp 10/02/20 0811 17     Temp 10/02/20 0811 98.4 F (36.9 C)     Temp Source 10/02/20 0811 Oral     SpO2 10/02/20 0811 97 %     Weight 10/02/20 0812 282 lb (127.9 kg)     Height 10/02/20 0812 5\' 7"  (1.702 m)     Head  Circumference --      Peak Flow --      Pain Score 10/02/20 0812 4     Pain Loc --      Pain Edu? --      Excl. in [place]? --     Constitutional: Alert and oriented. Well appearing and in no acute distress. Eyes: Conjunctivae are normal.  Head: Atraumatic. Nose: No congestion/rhinnorhea. Mouth/Throat: Mucous membranes are moist.   Neck:  supple no lymphadenopathy noted Cardiovascular: Normal rate, regular rhythm.  Respiratory: Normal respiratory effort.  No retractions, Breast: Left breast has a small penny sized raised tender area.  No drainage or ulceration noted GU: deferred Musculoskeletal: FROM all extremities, warm and well perfused Neurologic:  Normal speech and language.  Skin:  Skin is warm, dry and intact. No rash noted. Psychiatric: Mood and affect are normal. Speech and behavior are normal.  ____________________________________________   LABS (all labs ordered are listed, but only abnormal results are displayed)  Labs Reviewed - No data to display ____________________________________________   ____________________________________________  RADIOLOGY    ____________________________________________   PROCEDURES  Procedure(s) performed: No  Procedures    ____________________________________________   INITIAL IMPRESSION / ASSESSMENT AND PLAN / ED COURSE  Pertinent labs & imaging results that were available during my care of the patient were reviewed by me and considered in my medical decision making (see chart for details).   Patient is 35 year old female presents for a questionable abscess to her left breast.  See HPI.  Physical exam shows patient stable.  Area on left breast is tender and slightly raised.  I explained to the patient we will give her antibiotic.  If this area does not clear up with antibiotic treatment and she will need to follow-up with her primary care doctor for further evaluation.  States she understands.  She is discharged stable  condition.     Kathryn Valencia was evaluated in Emergency Department on 10/02/2020 for the symptoms described in the history of present illness. She was evaluated in the context of the global COVID-19 pandemic, which necessitated consideration that the patient might be at risk for infection with the SARS-CoV-2 virus that causes COVID-19. Institutional protocols and algorithms that pertain to the evaluation of patients at risk for COVID-19 are in a state of rapid change based on information released by regulatory bodies including the CDC and federal and state organizations. These policies and algorithms were followed during the patient's care in the ED.    As part of my medical decision making, I reviewed the following data within the [facility] notes reviewed and incorporated, Old chart reviewed, Notes from prior ED visits and [place] Controlled Substance Database  ____________________________________________   FINAL CLINICAL IMPRESSION(S) / ED DIAGNOSES  Final diagnoses:  Abscess      NEW MEDICATIONS STARTED DURING THIS VISIT:  New Prescriptions   CEPHALEXIN (KEFLEX) 500 MG  CAPSULE    Take 1 capsule (500 mg total) by mouth 3 (three) times daily.     Note:  This document was prepared using Dragon voice recognition software and may include unintentional dictation errors.    Versie Starks, PA-C 10/02/20 1059    Delman Kitten, MD 10/02/20 [phone]

## 2021-03-12 IMAGING — CR DG HIP (WITH OR WITHOUT PELVIS) 2-3V*R*
3 series · 3 of 3 positions shown · non-contrast
Comparison: CT 03/26/2017.

CLINICAL DATA: Radicular back pain.

EXAM:
DG HIP (WITH OR WITHOUT PELVIS) 2-3V RIGHT

[pelvis ap]
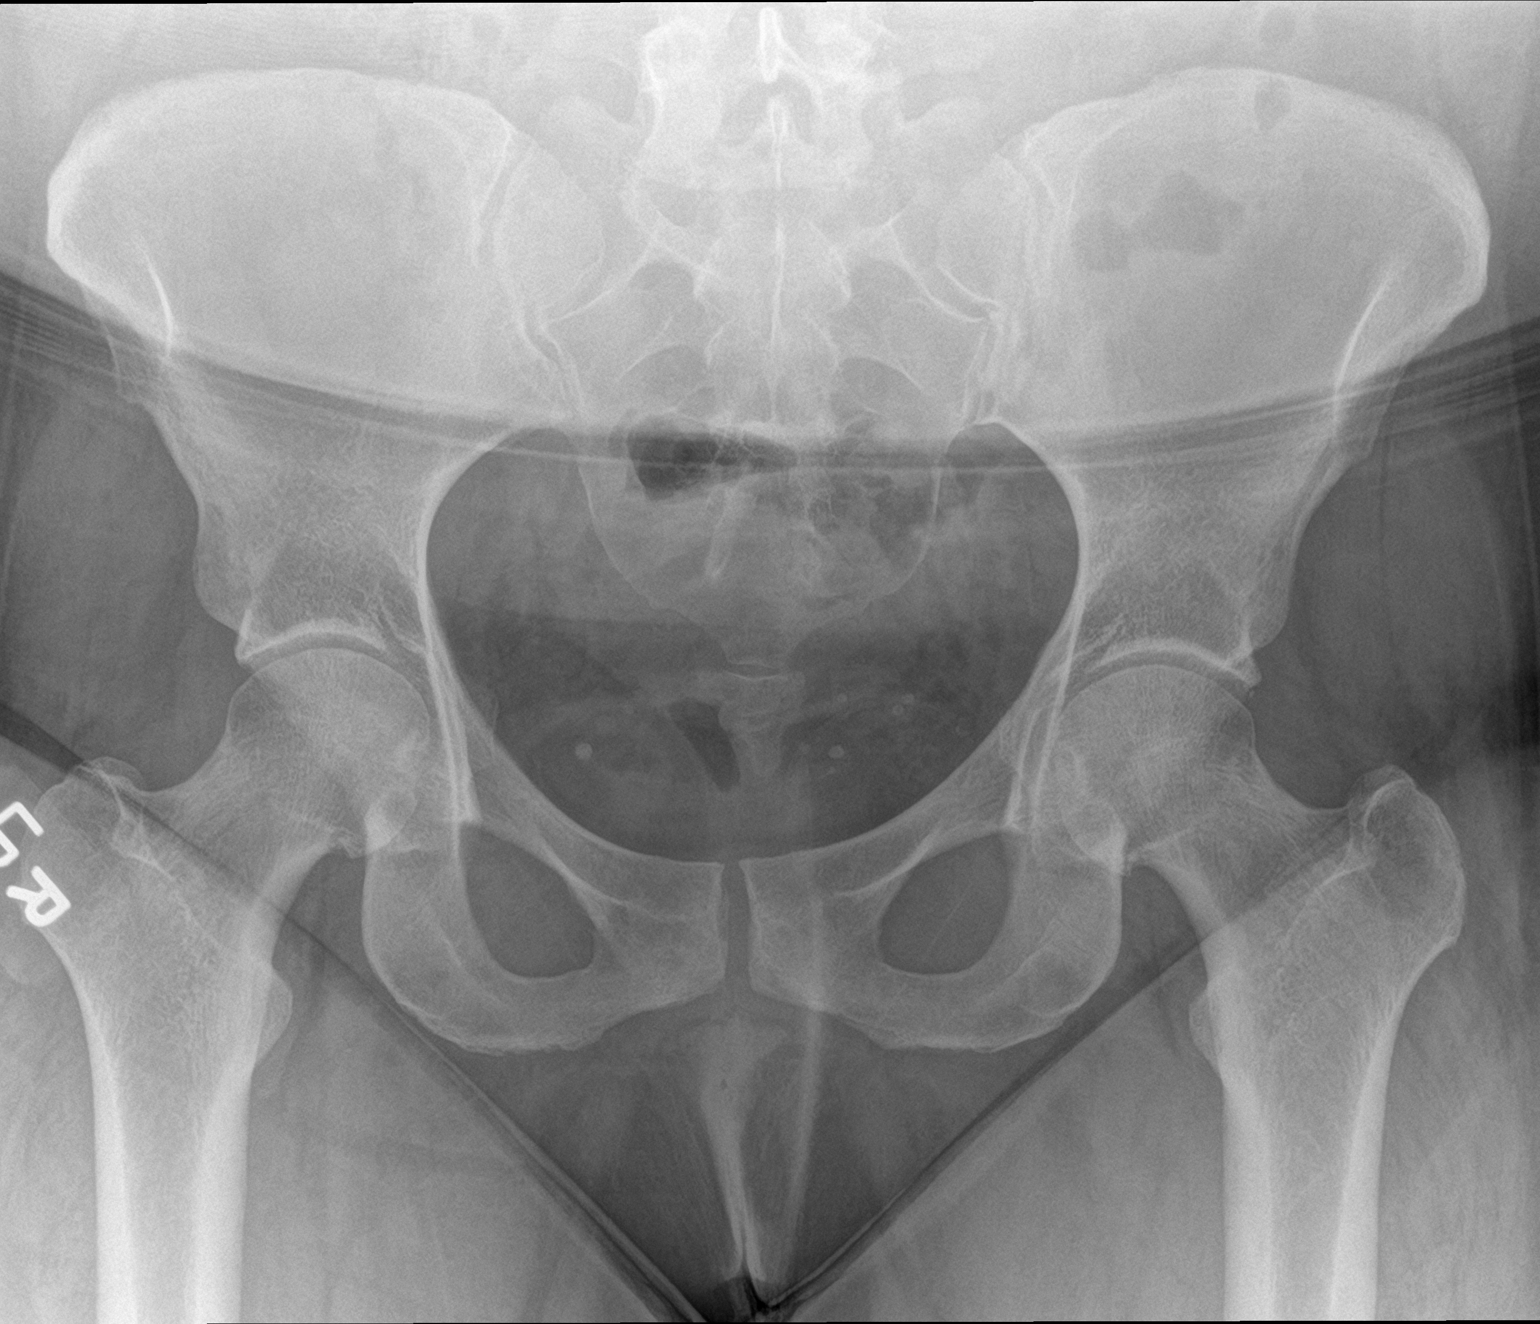

[hip ap]
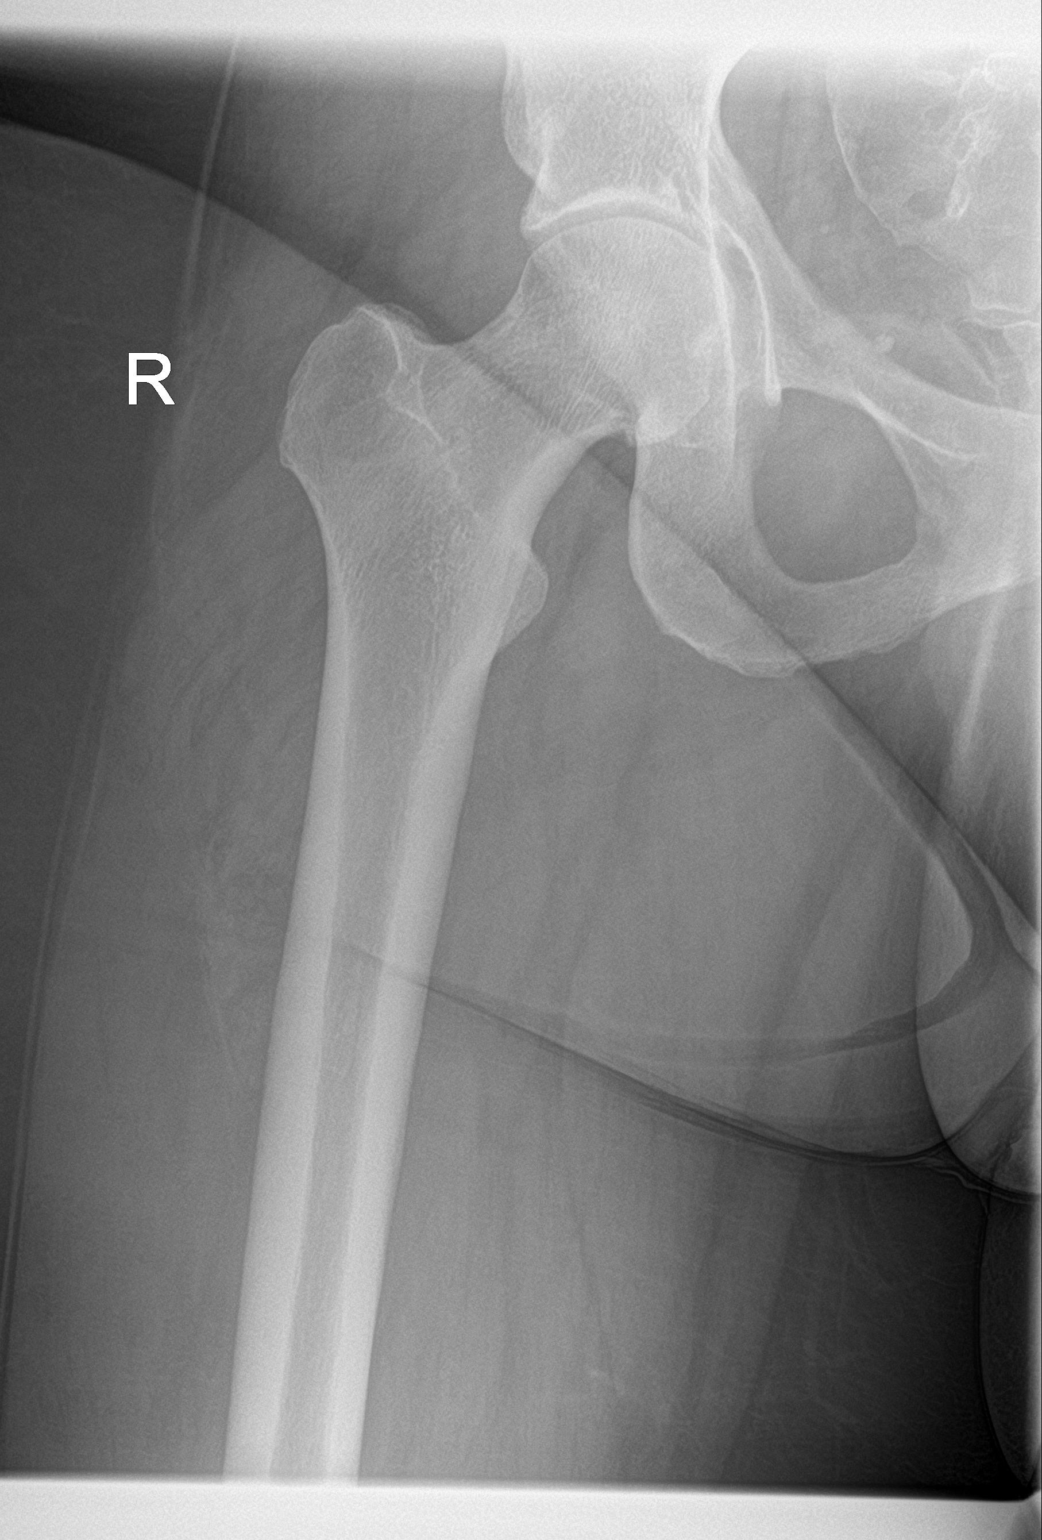

[hip lat]
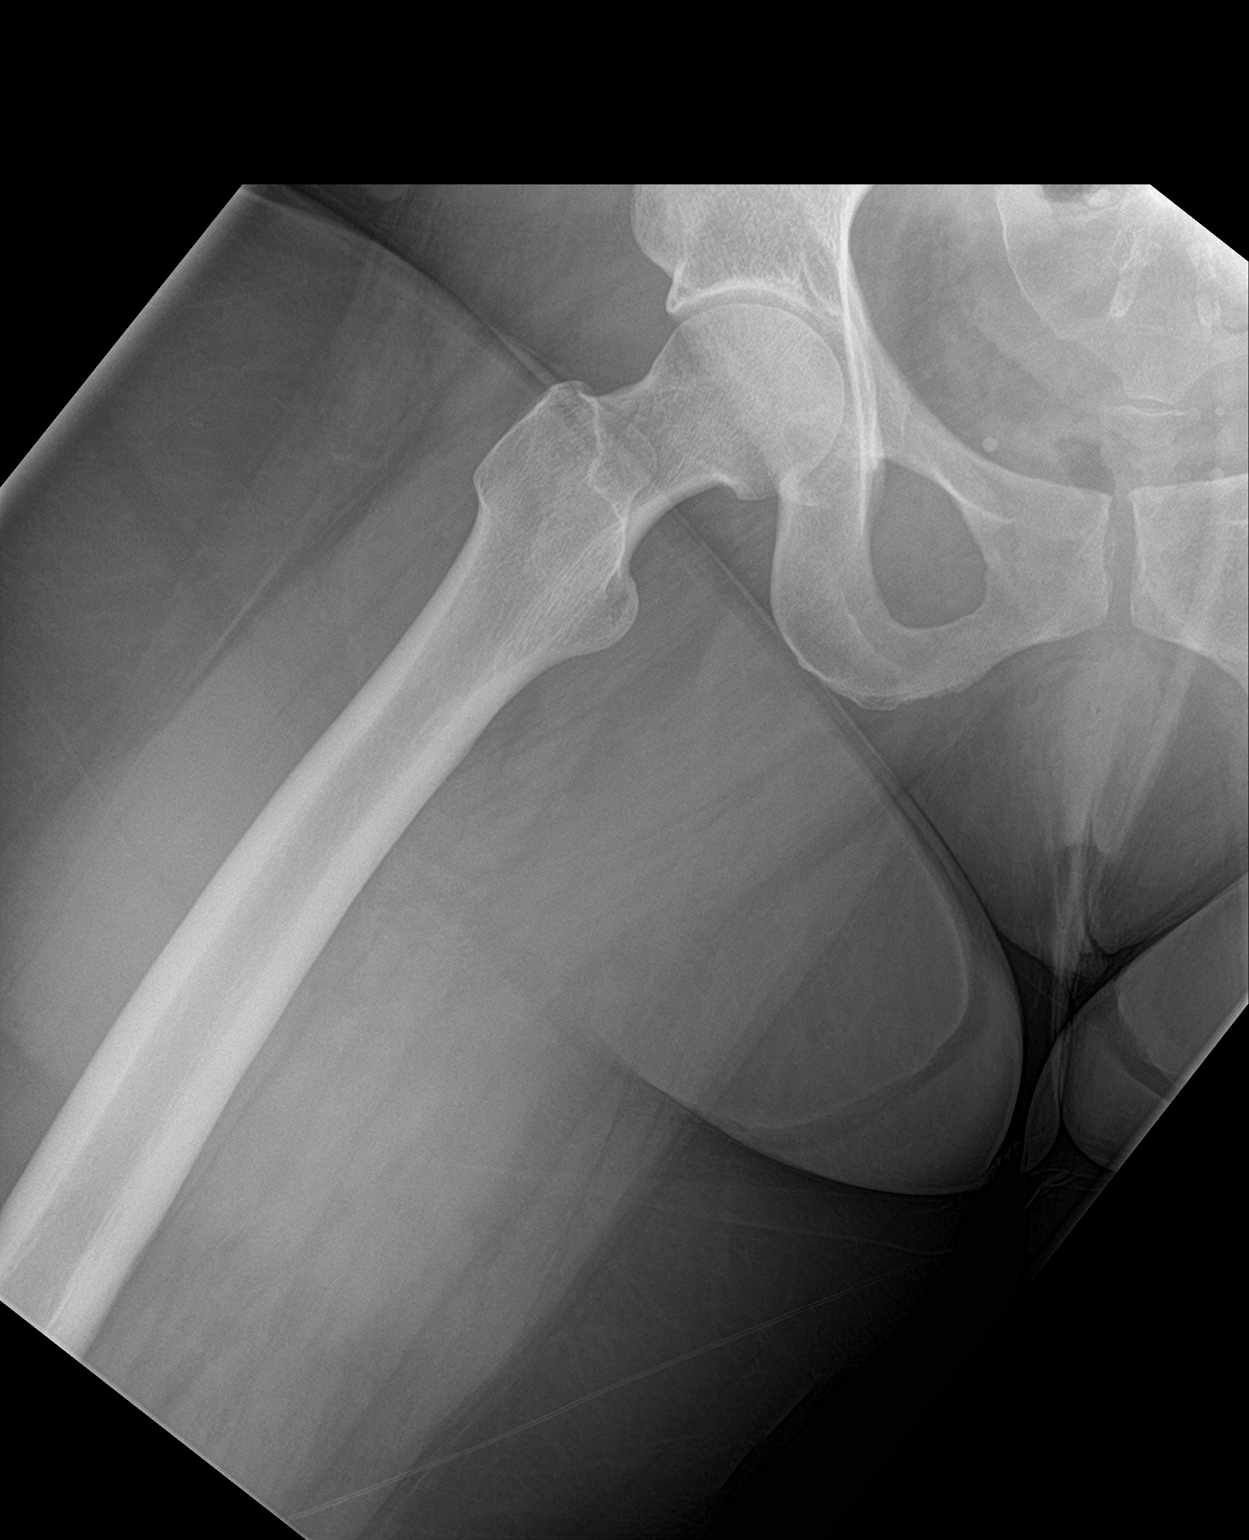

[3 of 3 positions shown; findings below may reference images not displayed]

FINDINGS: No acute bony or joint abnormality. No evidence of fracture or
dislocation. Pelvic calcifications consistent phleboliths.
IMPRESSION: No acute abnormality.

## 2021-09-19 ENCOUNTER — Emergency Department: Payer: Medicaid Other

## 2021-09-19 ENCOUNTER — Emergency Department
Admission: EM | Admit: 2021-09-19 | Discharge: 2021-09-19 | Disposition: A | Discharge status: Home / Self Care | Payer: Medicaid Other | Attending: Emergency Medicine | Admitting: Emergency Medicine

## 2021-09-19 ENCOUNTER — Other Ambulatory Visit: Payer: Self-pay

## 2021-09-19 ENCOUNTER — Encounter: Payer: Self-pay | Admitting: Emergency Medicine

## 2021-09-19 DIAGNOSIS — R059 Cough, unspecified: Secondary | ICD-10-CM | POA: Diagnosis present

## 2021-09-19 DIAGNOSIS — J069 Acute upper respiratory infection, unspecified: Secondary | ICD-10-CM

## 2021-09-19 DIAGNOSIS — B9789 Other viral agents as the cause of diseases classified elsewhere: Secondary | ICD-10-CM | POA: Insufficient documentation

## 2021-09-19 MED ORDER — GUAIFENESIN-CODEINE 100-10 MG/5ML PO SOLN: 5.0000 mL | Freq: Four times a day (QID) | ORAL | 0 refills | Status: DC | PRN

## 2021-09-19 NOTE — ED Provider Notes (Signed)
? ?[facility] ?Provider Note ? ? ? Event Date/Time  ? First MD Initiated Contact with Patient 09/19/21 [phone]   ?  (approximate) ? ? ?History  ? ?Sore Throat, Shortness of Breath, and Cough ? ? ?HPI ? ?Kathryn Valencia is a 36 y.o. female   presents to the ED with complaint of cough, congestion, sore throat, and shortness of breath for 1 week.  Patient has been taking over-the-counter medications such as Mucinex and allergy medication without any relief and continues to cough.  Patient denies any fever, chills, nausea, vomiting or diarrhea.  Patient is non-smoker and has a history of urinary tract infections, depression, GERD, acoustic neuroma, chronic headaches.  She rates her pain as an 8 out of 10. ? ?  ? ? ?Physical Exam  ? ?Triage Vital Signs: ?ED Triage Vitals  ?Enc Vitals Group  ?   BP 09/19/21 0852 110/68  ?   Pulse Rate 09/19/21 0852 70  ?   Resp 09/19/21 0852 17  ?   Temp 09/19/21 0852 97.9 ?F (36.6 ?C)  ?   Temp Source 09/19/21 0852 Oral  ?   SpO2 09/19/21 0852 97 %  ?   Weight 09/19/21 0847 281 lb 15.5 oz (127.9 kg)  ?   Height 09/19/21 0847 '5\' 7"'$  (1.702 m)  ?   Head Circumference --   ?   Peak Flow --   ?   Pain Score 09/19/21 0847 8  ?   Pain Loc --   ?   Pain Edu? --   ?   Excl. in [place]? --   ? ? ?Most recent vital signs: ?Vitals:  ? 09/19/21 0852 09/19/21 0939  ?BP: 110/68 95/61  ?Pulse: 70 68  ?Resp: 17 18  ?Temp: 97.9 ?F (36.6 ?C)   ?SpO2: 97% 98%  ? ? ? ?General: Awake, no distress.  ?CV:  Good peripheral perfusion.  Heart regular rate and rhythm. ?Resp:  Normal effort.  Lungs are clear bilaterally. ?Abd:  No distention.  ?Other:  EACs and TMs are clear bilaterally.  Posterior pharynx is without erythema or exudate.  Uvula is midline.  No cervical lymphadenopathy is noted. ? ? ?ED Results / Procedures / Treatments  ? ?Labs ?(all labs ordered are listed, but only abnormal results are displayed) ?Labs Reviewed - No data to display ? ? ?EKG ? ?Normal sinus rhythm with a rate of  68. ?Vent. rate 68 BPM ?PR interval 158 ms ?QRS duration 86 ms ?QT/QTcB 380/404 ms ?P-R-T axes 45 37 18 ? ?RADIOLOGY ?Chest x-ray reviewed by myself independently of the radiologist is negative for pneumonia.  Radiology reports mild opacities bilateral medial lung bases due to scarring. ? ? ? ?PROCEDURES: ? ?Critical Care performed:  ? ?Procedures ? ? ?MEDICATIONS ORDERED IN ED: ?Medications - No data to display ? ? ?IMPRESSION / MDM / ASSESSMENT AND PLAN / ED COURSE  ?I reviewed the triage vital signs and the nursing notes. ? ? ?Differential diagnosis includes, but is not limited to, upper respiratory infection, allergies, strep pharyngitis, pneumonia, bronchitis. ? ? ?36 year old female presents to the ED with complaint of cough, congestion, sore throat and shortness of breath for 1 week.  Patient states she has used over-the-counter medication without any relief.  X-ray was negative for pneumonia.  Patient is a non-smoker.  No fever, chills, nausea or vomiting reported.  Patient is afebrile with an O2 sat of 97%.  Chest x-ray was reviewed with patient and was reassuring.  Robitussin-AC was sent to the pharmacy to help control her cough and she is encouraged to increase fluids.  Tylenol or ibuprofen if needed and follow-up with her PCP if any continued problems. ? ? ?  ? ? ?FINAL CLINICAL IMPRESSION(S) / ED DIAGNOSES  ? ?Final diagnoses:  ?Viral URI with cough  ? ? ? ?Rx / DC Orders  ? ?ED Discharge Orders   ? ?      Ordered  ?  guaiFENesin-codeine 100-10 MG/5ML syrup  Every 6 hours PRN       ? 09/19/21 0936  ? ?  ?  ? ?  ? ? ? ?Note:  This document was prepared using Dragon voice recognition software and may include unintentional dictation errors. ?  ?Johnn Hai, PA-C ?09/19/21 1222 ? ?  ?Vladimir Crofts, MD ?09/19/21 1413 ? ?

## 2021-09-19 NOTE — ED Notes (Signed)
Pt. To rad. 

## 2021-09-19 NOTE — ED Triage Notes (Signed)
Pt comes into the ED via POV c/o sore throat, cough, and SHOB x 1 week.  Pt currently has even and unlabored respirations.   ?

## 2021-09-19 NOTE — Discharge Instructions (Addendum)
Follow-up with your primary care provider if any continued problems.  Increase fluids.  Tylenol or ibuprofen if needed for headache, body aches or fever.  You may continue with your allergy medication.  A prescription for medication that has codeine in it was sent to the pharmacy to take for cough and congestion.  Increase fluids. ?

## 2022-04-14 ENCOUNTER — Other Ambulatory Visit: Payer: Self-pay

## 2022-04-14 ENCOUNTER — Encounter: Payer: Self-pay | Admitting: Emergency Medicine

## 2022-04-14 ENCOUNTER — Emergency Department
Admission: EM | Admit: 2022-04-14 | Discharge: 2022-04-14 | Disposition: A | Discharge status: Home / Self Care | Payer: Medicaid Other | Attending: Emergency Medicine | Admitting: Emergency Medicine

## 2022-04-14 ENCOUNTER — Emergency Department: Payer: Medicaid Other

## 2022-04-14 DIAGNOSIS — M5416 Radiculopathy, lumbar region: Secondary | ICD-10-CM | POA: Diagnosis not present

## 2022-04-14 DIAGNOSIS — M545 Low back pain, unspecified: Secondary | ICD-10-CM | POA: Diagnosis present

## 2022-04-14 MED ORDER — CYCLOBENZAPRINE HCL 10 MG PO TABS
10.0000 mg | ORAL_TABLET | Freq: Once | ORAL | Status: AC
  Administered 2022-04-14: 10 mg via ORAL
  Filled 2022-04-14: qty 1

## 2022-04-14 MED ORDER — MELOXICAM 15 MG PO TABS: 15.0000 mg | ORAL_TABLET | Freq: Every day | ORAL | 2 refills | Status: DC

## 2022-04-14 MED ORDER — BACLOFEN 10 MG PO TABS: 10.0000 mg | ORAL_TABLET | Freq: Three times a day (TID) | ORAL | 0 refills | Status: AC

## 2022-04-14 MED ORDER — IBUPROFEN 600 MG PO TABS
600.0000 mg | ORAL_TABLET | Freq: Once | ORAL | Status: AC
  Administered 2022-04-14: 600 mg via ORAL
  Filled 2022-04-14: qty 1

## 2022-04-14 NOTE — ED Provider Notes (Signed)
[facility] Provider Note    Event Date/Time   First MD Initiated Contact with Patient 04/14/22 1421     (approximate)   History   Back Pain   HPI  Kathryn Valencia is a 36 y.o. female with history of acoustic neuroma, morbid obesity presents emergency department with complaints of low back pain.  Patient states pain is shooting down her right leg.  Some numbness.  Had an ongoing problem with her right leg due to a fall about 6 months ago but her back started hurting yesterday.  No loss of bowel or bladder control.  Patient states she would like to sign stated she is not pregnant versus doing POC pregnancy      Physical Exam   Triage Vital Signs: ED Triage Vitals  Enc Vitals Group     BP 04/14/22 1351 (!) 108/59     Pulse Rate 04/14/22 1351 (!) 59     Resp 04/14/22 1351 16     Temp 04/14/22 1351 98.7 F (37.1 C)     Temp Source 04/14/22 1351 Oral     SpO2 04/14/22 1351 97 %     Weight 04/14/22 1349 281 lb 15.5 oz (127.9 kg)     Height 04/14/22 1349 '5\' 7"'$  (1.702 m)     Head Circumference --      Peak Flow --      Pain Score 04/14/22 1348 10     Pain Loc --      Pain Edu? --      Excl. in [place]? --     Most recent vital signs: Vitals:   04/14/22 1351  BP: (!) 108/59  Pulse: (!) 59  Resp: 16  Temp: 98.7 F (37.1 C)  SpO2: 97%     General: Awake, no distress.   CV:  Good peripheral perfusion. regular rate and  rhythm Resp:  Normal effort.  Abd:  No distention.   Other:  Lumbar spine tender to palpation, patient is able to ambulate without difficulty, 5/5 strength lower extremities   ED Results / Procedures / Treatments   Labs (all labs ordered are listed, but only abnormal results are displayed) Labs Reviewed - No data to display   EKG     RADIOLOGY     PROCEDURES:   Procedures   MEDICATIONS ORDERED IN ED: Medications  ibuprofen (ADVIL) tablet 600 mg (has no administration in time range)  cyclobenzaprine  (FLEXERIL) tablet 10 mg (has no administration in time range)     IMPRESSION / MDM / ASSESSMENT AND PLAN / ED COURSE  I reviewed the triage vital signs and the nursing notes.                              Differential diagnosis includes, but is not limited to, lumbar strain, occult fracture, compression fracture  Patient's presentation is most consistent with acute complicated illness / injury requiring diagnostic workup.   X-ray lumbar spine independently reviewed and interpreted by me as being negative for any acute abnormality.  Radiologist comments some degenerative changes.  I did explain these findings to the patient.  She is to take meloxicam and baclofen.  Apply ice to lower back.  Follow-up with her regular doctor if not improving in 3 to 4 days.  Return if worsening.  She is in agreement treatment plan.  Discharged stable condition.      FINAL CLINICAL IMPRESSION(S) / ED DIAGNOSES  Final diagnoses:  Lumbar radiculopathy     Rx / DC Orders   ED Discharge Orders          Ordered    meloxicam (MOBIC) 15 MG tablet  Daily        04/14/22 1512    baclofen (LIORESAL) 10 MG tablet  3 times daily        04/14/22 1512             Note:  This document was prepared using Dragon voice recognition software and may include unintentional dictation errors.    Versie Starks, PA-C 04/14/22 1515    Lavonia Drafts, MD 04/14/22 [phone]

## 2022-04-14 NOTE — ED Triage Notes (Signed)
C/O lower back pain, right lower back.  States pain shooting  down right leg and feeling numbness.  STates has had an ongoing problem with right leg with pain and numbness for several months after injuring back while putting garbage into a dumpster and falling.  AAOx3.  Skin warm and dry.NAD

## 2022-04-14 NOTE — Discharge Instructions (Signed)
Follow-up with your regular doctor if not improving to 3 days.  Return emergency department if worsening.  Take medications as prescribed.  Apply ice to your lower back.

## 2022-06-06 ENCOUNTER — Encounter: Payer: Self-pay | Admitting: Emergency Medicine

## 2022-06-06 ENCOUNTER — Other Ambulatory Visit: Payer: Self-pay

## 2022-06-06 ENCOUNTER — Emergency Department
Admission: EM | Admit: 2022-06-06 | Discharge: 2022-06-06 | Disposition: A | Discharge status: Home / Self Care | Payer: Medicaid Other | Attending: Emergency Medicine | Admitting: Emergency Medicine

## 2022-06-06 DIAGNOSIS — R0981 Nasal congestion: Secondary | ICD-10-CM | POA: Diagnosis present

## 2022-06-06 DIAGNOSIS — Z1152 Encounter for screening for COVID-19: Secondary | ICD-10-CM | POA: Insufficient documentation

## 2022-06-06 DIAGNOSIS — J101 Influenza due to other identified influenza virus with other respiratory manifestations: Secondary | ICD-10-CM | POA: Insufficient documentation

## 2022-06-06 LAB — COMPREHENSIVE METABOLIC PANEL
ALT: 16 U/L (ref 0–44)
AST: 22 U/L (ref 15–41)
Albumin: 3.6 g/dL (ref 3.5–5.0)
Alkaline Phosphatase: 29 U/L — ABNORMAL LOW (ref 38–126)
Anion gap: 10 (ref 5–15)
BUN: 10 mg/dL (ref 6–20)
CO2: 20 mmol/L — ABNORMAL LOW (ref 22–32)
Calcium: 8.7 mg/dL — ABNORMAL LOW (ref 8.9–10.3)
Chloride: 108 mmol/L (ref 98–111)
Creatinine, Ser: 0.65 mg/dL (ref 0.44–1.00)
GFR, Estimated: >60 mL/min (ref >60)
Glucose, Bld: 78 mg/dL (ref 70–99)
Potassium: 3.3 mmol/L — ABNORMAL LOW (ref 3.5–5.1)
Sodium: 138 mmol/L (ref 135–145)
Total Bilirubin: 0.7 mg/dL (ref 0.3–1.2)
Total Protein: 6.7 g/dL (ref 6.5–8.1)

## 2022-06-06 LAB — RESP PANEL BY RT-PCR (RSV, FLU A&B, COVID)  RVPGX2
Influenza A by PCR: POSITIVE — AB
Influenza B by PCR: NEGATIVE
Resp Syncytial Virus by PCR: NEGATIVE
SARS Coronavirus 2 by RT PCR: NEGATIVE

## 2022-06-06 LAB — CBC
HCT: 39.3 % (ref 36.0–46.0)
Hemoglobin: 13.2 g/dL (ref 12.0–15.0)
MCH: 30.9 pg (ref 26.0–34.0)
MCHC: 33.6 g/dL (ref 30.0–36.0)
MCV: 92 fL (ref 80.0–100.0)
Platelets: 196 10*3/uL (ref 150–400)
RBC: 4.27 MIL/uL (ref 3.87–5.11)
RDW: 12.8 % (ref 11.5–15.5)
WBC: 3.4 10*3/uL — ABNORMAL LOW (ref 4.0–10.5)
nRBC: 0 % (ref 0.0–0.2)

## 2022-06-06 LAB — LIPASE, BLOOD: Lipase: 32 U/L (ref 11–51)

## 2022-06-06 MED ORDER — ONDANSETRON 4 MG PO TBDP
4.0000 mg | ORAL_TABLET | Freq: Once | ORAL | Status: AC
  Administered 2022-06-06: 4 mg via ORAL
  Filled 2022-06-06: qty 1

## 2022-06-06 MED ORDER — ONDANSETRON 4 MG PO TBDP: 4.0000 mg | ORAL_TABLET | Freq: Three times a day (TID) | ORAL | 0 refills | Status: AC | PRN

## 2022-06-06 NOTE — ED Provider Notes (Signed)
[facility] Provider Note    Event Date/Time   First MD Initiated Contact with Patient 06/06/22 [phone]     (approximate)   History   Vomiting   HPI  Kathryn Valencia is a 36 y.o. female who presents with complaints of nausea, loss of taste, congestion, fatigue for about 3 to 4 days.  Home test was negative for COVID     Physical Exam   Triage Vital Signs: ED Triage Vitals  Enc Vitals Group     BP 06/06/22 0714 101/73     Pulse Rate 06/06/22 0712 97     Resp 06/06/22 0712 18     Temp 06/06/22 0712 98.6 F (37 C)     Temp Source 06/06/22 0712 Oral     SpO2 06/06/22 0712 98 %     Weight 06/06/22 0712 117.9 kg (260 lb)     Height 06/06/22 0712 1.702 m ('5\' 7"'$ )     Head Circumference --      Peak Flow --      Pain Score 06/06/22 0712 7     Pain Loc --      Pain Edu? --      Excl. in [place]? --     Most recent vital signs: Vitals:   06/06/22 0712 06/06/22 0714  BP:  101/73  Pulse: 97   Resp: 18   Temp: 98.6 F (37 C)   SpO2: 98%      General: Awake, no distress.  CV:  Good peripheral perfusion.  Resp:  Normal effort.  Abd:  No distention.  Other:     ED Results / Procedures / Treatments   Labs (all labs ordered are listed, but only abnormal results are displayed) Labs Reviewed  RESP PANEL BY RT-PCR (RSV, FLU A&B, COVID)  RVPGX2 - Abnormal; Notable for the following components:      Result Value   Influenza A by PCR POSITIVE (*)    All other components within normal limits  CBC - Abnormal; Notable for the following components:   WBC 3.4 (*)    All other components within normal limits  COMPREHENSIVE METABOLIC PANEL - Abnormal; Notable for the following components:   Potassium 3.3 (*)    CO2 20 (*)    Calcium 8.7 (*)    Alkaline Phosphatase 29 (*)    All other components within normal limits  LIPASE, BLOOD     EKG     RADIOLOGY     PROCEDURES:  Critical Care performed:   Procedures   MEDICATIONS ORDERED  IN ED: Medications  ondansetron (ZOFRAN-ODT) disintegrating tablet 4 mg (4 mg Oral Given 06/06/22 0831)     IMPRESSION / MDM / ASSESSMENT AND PLAN / ED COURSE  I reviewed the triage vital signs and the nursing notes. Patient's presentation is most consistent with acute illness / injury with system symptoms.  Patient presents with multiple symptoms consistent with likely viral illness.  Lab work reviewed and is quite reassuring.  Respiratory panel is positive for influenza A, no indication for Tamiflu, recommend supportive care, outpatient follow-up as needed.  And prescription provided        FINAL CLINICAL IMPRESSION(S) / ED DIAGNOSES   Final diagnoses:  Influenza A     Rx / DC Orders   ED Discharge Orders          Ordered    ondansetron (ZOFRAN-ODT) 4 MG disintegrating tablet  Every 8 hours PRN  06/06/22 9509             Note:  This document was prepared using Dragon voice recognition software and may include unintentional dictation errors.   Lavonia Drafts, MD 06/06/22 [phone]

## 2022-06-06 NOTE — ED Triage Notes (Signed)
Pt in with co n.v.d for 3-4 days. Saw the urgent care and tested negative for Covid and flu but now has loss of taste.

## 2022-09-10 ENCOUNTER — Other Ambulatory Visit: Payer: Self-pay | Admitting: Orthopedic Surgery

## 2022-09-10 DIAGNOSIS — M5416 Radiculopathy, lumbar region: Secondary | ICD-10-CM

## 2022-09-13 ENCOUNTER — Ambulatory Visit
Admission: RE | Admit: 2022-09-13 | Discharge: 2022-09-13 | Disposition: A | Discharge status: Home / Self Care | Payer: Medicaid Other | Source: Ambulatory Visit | Attending: Orthopedic Surgery | Admitting: Orthopedic Surgery

## 2022-09-13 DIAGNOSIS — M5416 Radiculopathy, lumbar region: Secondary | ICD-10-CM

## 2023-07-05 ENCOUNTER — Emergency Department
Admission: EM | Admit: 2023-07-05 | Discharge: 2023-07-05 | Disposition: A | Discharge status: Home / Self Care | Payer: Medicaid Other | Attending: Emergency Medicine | Admitting: Emergency Medicine

## 2023-07-05 ENCOUNTER — Other Ambulatory Visit: Payer: Self-pay

## 2023-07-05 DIAGNOSIS — A084 Viral intestinal infection, unspecified: Secondary | ICD-10-CM | POA: Diagnosis not present

## 2023-07-05 DIAGNOSIS — R112 Nausea with vomiting, unspecified: Secondary | ICD-10-CM | POA: Diagnosis present

## 2023-07-05 DIAGNOSIS — Z20822 Contact with and (suspected) exposure to covid-19: Secondary | ICD-10-CM | POA: Insufficient documentation

## 2023-07-05 LAB — CBC WITH DIFFERENTIAL/PLATELET
Abs Immature Granulocytes: 0.02 10*3/uL (ref 0.00–0.07)
Basophils Absolute: 0 10*3/uL (ref 0.0–0.1)
Basophils Relative: 0 %
Eosinophils Absolute: 0 10*3/uL (ref 0.0–0.5)
Eosinophils Relative: 0 %
HCT: 36.9 % (ref 36.0–46.0)
Hemoglobin: 12.8 g/dL (ref 12.0–15.0)
Immature Granulocytes: 0 %
Lymphocytes Relative: 8 %
Lymphs Abs: 0.6 10*3/uL — ABNORMAL LOW (ref 0.7–4.0)
MCH: 31.8 pg (ref 26.0–34.0)
MCHC: 34.7 g/dL (ref 30.0–36.0)
MCV: 91.8 fL (ref 80.0–100.0)
Monocytes Absolute: 0.3 10*3/uL (ref 0.1–1.0)
Monocytes Relative: 3 %
Neutro Abs: 7.5 10*3/uL (ref 1.7–7.7)
Neutrophils Relative %: 89 %
Platelets: 194 10*3/uL (ref 150–400)
RBC: 4.02 MIL/uL (ref 3.87–5.11)
RDW: 12.5 % (ref 11.5–15.5)
WBC: 8.5 10*3/uL (ref 4.0–10.5)
nRBC: 0 % (ref 0.0–0.2)

## 2023-07-05 LAB — COMPREHENSIVE METABOLIC PANEL
ALT: 13 U/L (ref 0–44)
AST: 18 U/L (ref 15–41)
Albumin: 4 g/dL (ref 3.5–5.0)
Alkaline Phosphatase: 35 U/L — ABNORMAL LOW (ref 38–126)
Anion gap: 10 (ref 5–15)
BUN: 10 mg/dL (ref 6–20)
CO2: 17 mmol/L — ABNORMAL LOW (ref 22–32)
Calcium: 9.3 mg/dL (ref 8.9–10.3)
Chloride: 107 mmol/L (ref 98–111)
Creatinine, Ser: 0.52 mg/dL (ref 0.44–1.00)
GFR, Estimated: >60 mL/min (ref >60)
Glucose, Bld: 143 mg/dL — ABNORMAL HIGH (ref 70–99)
Potassium: 3.5 mmol/L (ref 3.5–5.1)
Sodium: 134 mmol/L — ABNORMAL LOW (ref 135–145)
Total Bilirubin: 0.9 mg/dL (ref 0.0–1.2)
Total Protein: 7.5 g/dL (ref 6.5–8.1)

## 2023-07-05 LAB — RESP PANEL BY RT-PCR (RSV, FLU A&B, COVID)  RVPGX2
Influenza A by PCR: NEGATIVE
Influenza B by PCR: NEGATIVE
Resp Syncytial Virus by PCR: NEGATIVE
SARS Coronavirus 2 by RT PCR: NEGATIVE

## 2023-07-05 LAB — LIPASE, BLOOD: Lipase: 31 U/L (ref 11–51)

## 2023-07-05 MED ORDER — ONDANSETRON 4 MG PO TBDP
4.0000 mg | ORAL_TABLET | Freq: Once | ORAL | Status: DC
  Filled 2023-07-05: qty 1

## 2023-07-05 MED ORDER — SODIUM CHLORIDE 0.9 % IV BOLUS
1000.0000 mL | Freq: Once | INTRAVENOUS | Status: AC
  Administered 2023-07-05: 1000 mL via INTRAVENOUS

## 2023-07-05 MED ORDER — ONDANSETRON HCL 4 MG/2ML IJ SOLN
4.0000 mg | Freq: Once | INTRAMUSCULAR | Status: AC
  Administered 2023-07-05: 4 mg via INTRAVENOUS
  Filled 2023-07-05: qty 2

## 2023-07-05 NOTE — ED Provider Notes (Signed)
[facility] Provider Note    Event Date/Time   First MD Initiated Contact with Patient 07/05/23 0703     (approximate)   History   Emesis   HPI Kathryn Valencia is a 38 y.o. female presenting today for nausea and vomiting.  Patient states last night she had onset of nausea, vomiting, and diarrhea.  Has not been able to tolerate any p.o.  Feeling generalized stomach cramping and fatigue.  Otherwise denies fever, cough, congestion, chest pain, shortness of breath.  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     Physical Exam   Triage Vital Signs: ED Triage Vitals [07/05/23 0644]  Encounter Vitals Group     BP 119/70     Systolic BP Percentile      Diastolic BP Percentile      Pulse Rate 64     Resp 16     Temp 98.7 F (37.1 C)     Temp Source Oral     SpO2 91 %     Weight 250 lb (113.4 kg)     Height 5\' 7"  (1.702 m)     Head Circumference      Peak Flow      Pain Score 10     Pain Loc      Pain Education      Exclude from Growth Chart     Most recent vital signs: Vitals:   07/05/23 0644  BP: 119/70  Pulse: 64  Resp: 16  Temp: 98.7 F (37.1 C)  SpO2: 91%   Physical Exam: I have reviewed the vital signs and nursing notes. General: Awake, alert, no acute distress.  Nontoxic appearing. Head:  Atraumatic, normocephalic.   ENT:  EOM intact, PERRL. Oral mucosa is pink and moist with no lesions. Neck: Neck is supple with full range of motion, No meningeal signs. Cardiovascular:  RRR, No murmurs. Peripheral pulses palpable and equal bilaterally. Respiratory:  Symmetrical chest wall expansion.  No rhonchi, rales, or wheezes.  Good air movement throughout.  No use of accessory muscles.   Musculoskeletal:  No cyanosis or edema. Moving extremities with full ROM Abdomen:  Soft, nontender, nondistended. Neuro:  GCS 15, moving all four extremities, interacting appropriately. Speech clear. Psych:  Calm, appropriate.    Skin:  Warm, dry, no rash.    ED Results / Procedures / Treatments   Labs (all labs ordered are listed, but only abnormal results are displayed) Labs Reviewed  CBC WITH DIFFERENTIAL/PLATELET - Abnormal; Notable for the following components:      Result Value   Lymphs Abs 0.6 (*)    All other components within normal limits  COMPREHENSIVE METABOLIC PANEL - Abnormal; Notable for the following components:   Sodium 134 (*)    CO2 17 (*)    Glucose, Bld 143 (*)    Alkaline Phosphatase 35 (*)    All other components within normal limits  RESP PANEL BY RT-PCR (RSV, FLU A&B, COVID)  RVPGX2  LIPASE, BLOOD  POC URINE PREG, ED     EKG    RADIOLOGY    PROCEDURES:  Critical Care performed: No  Procedures   MEDICATIONS ORDERED IN ED: Medications  ondansetron (ZOFRAN) injection 4 mg (4 mg Intravenous Given 07/05/23 0727)  sodium chloride 0.9 % bolus 1,000 mL (1,000 mLs Intravenous New Bag/Given 07/05/23 0727)     IMPRESSION / MDM / ASSESSMENT AND PLAN / ED COURSE  I reviewed the triage vital  signs and the nursing notes.                              Differential diagnosis includes, but is not limited to, viral gastroenteritis, viral URI  Patient's presentation is most consistent with acute complicated illness / injury requiring diagnostic workup.  Patient is a 37 year old female presenting today for nausea, vomiting, and diarrhea.  Sick exposures at work and seems most consistent with viral GI infection.  Vital signs are stable on arrival and physical exam is unremarkable.  Abdominal pain is generalized with no cramping with low concerns for acute intra-abdominal pathology.  Patient given Zofran and 1 L fluids.  Laboratory workup reassuring at this time.  Patient able to tolerate p.o. without issue.  Will discharge with symptomatic treatment at home for viral gastroenteritis.  Told to follow-up with PCP and given strict return precautions.  Clinical Course as of 07/05/23 0827   Sun Jul 05, 2023  0827 Tolerating p.o. [DW]    Clinical Course User Index [DW] Janith Lima, MD     FINAL CLINICAL IMPRESSION(S) / ED DIAGNOSES   Final diagnoses:  Viral gastroenteritis     Rx / DC Orders   ED Discharge Orders     None        Note:  This document was prepared using Dragon voice recognition software and may include unintentional dictation errors.   Janith Lima, MD 07/05/23 [phone]

## 2023-07-05 NOTE — ED Triage Notes (Signed)
Pt to ed from home via POV for emesis and diarrhea. 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 Pt is caox4, in no acute distress and has had some dry heaving in triage but has her bucket of vomit with her.

## 2023-07-05 NOTE — Discharge Instructions (Addendum)
Please continue to take the Zofran at home as needed.  Nausea, vomiting, diarrhea symptoms should slowly improve over the next 24 hours.  Maintain hydration during that time period.

## 2024-03-11 ENCOUNTER — Other Ambulatory Visit: Payer: Self-pay | Admitting: Family Medicine

## 2024-03-11 DIAGNOSIS — Z1231 Encounter for screening mammogram for malignant neoplasm of breast: Secondary | ICD-10-CM

## 2024-03-28 ENCOUNTER — Other Ambulatory Visit: Payer: Self-pay | Admitting: Family Medicine

## 2024-03-28 DIAGNOSIS — N644 Mastodynia: Secondary | ICD-10-CM

## 2024-03-28 DIAGNOSIS — Z803 Family history of malignant neoplasm of breast: Secondary | ICD-10-CM

## 2024-04-11 ENCOUNTER — Ambulatory Visit
Admission: RE | Admit: 2024-04-11 | Discharge: 2024-04-11 | Disposition: A | Discharge status: Home / Self Care | Source: Ambulatory Visit | Attending: Family Medicine | Admitting: Family Medicine

## 2024-04-11 ENCOUNTER — Ambulatory Visit
Admission: RE | Admit: 2024-04-11 | Discharge: 2024-04-11 | Disposition: A | Source: Ambulatory Visit | Attending: Family Medicine | Admitting: Family Medicine

## 2024-04-11 DIAGNOSIS — N644 Mastodynia: Secondary | ICD-10-CM

## 2024-04-11 DIAGNOSIS — Z803 Family history of malignant neoplasm of breast: Secondary | ICD-10-CM

## 2024-07-13 ENCOUNTER — Emergency Department: Admission: EM | Admit: 2024-07-13 | Discharge: 2024-07-13 | Disposition: A | Discharge status: Home / Self Care

## 2024-07-13 ENCOUNTER — Other Ambulatory Visit: Payer: Self-pay

## 2024-07-13 DIAGNOSIS — J069 Acute upper respiratory infection, unspecified: Secondary | ICD-10-CM | POA: Insufficient documentation

## 2024-07-13 DIAGNOSIS — R0789 Other chest pain: Secondary | ICD-10-CM | POA: Insufficient documentation

## 2024-07-13 LAB — RESP PANEL BY RT-PCR (RSV, FLU A&B, COVID)  RVPGX2
Influenza A by PCR: NEGATIVE
Influenza B by PCR: NEGATIVE
Resp Syncytial Virus by PCR: NEGATIVE
SARS Coronavirus 2 by RT PCR: NEGATIVE

## 2024-07-13 MED ORDER — BENZONATATE 100 MG PO CAPS: 100.0000 mg | ORAL_CAPSULE | Freq: Three times a day (TID) | ORAL | 0 refills | Status: AC | PRN

## 2024-07-13 NOTE — ED Provider Notes (Signed)
 "  [facility] Provider Note    Event Date/Time   First MD Initiated Contact with Patient 07/13/24 0801     (approximate)   History   URI  C/O cough and sinus congestion x 5 days.  Symptoms initially started with sore throat, which has resolved.  AAOx3. Skin warm and dry. NAD   HPI Kathryn Valencia is a 39 y.o. female  pmh gerd, cyclic vomiting p/w nasal congestion, cough, chest discomfort - 1 wk congestion, sore throat. Cough, chest discomfort w/ cough over past two days. Some subjective fevers. Whole family at home with similar symptoms.  - reportedly went to UC 2 days ago, says prescribed cough suppressant with minimal relief. Says tested negative for flu. Records not available.  - Chest pain is only with cough, upper substernal.  No hemoptysis.  No leg swelling, no history of DVT/PE, no recent surgery/stasis/travel.  Is on hormonal birth control.  No pleuritic discomfort.      Physical Exam   Triage Vital Signs: ED Triage Vitals  Encounter Vitals Group     BP 07/13/24 0752 117/77     Girls Systolic BP Percentile --      Girls Diastolic BP Percentile --      Boys Systolic BP Percentile --      Boys Diastolic BP Percentile --      Pulse Rate 07/13/24 0752 (!) 57     Resp 07/13/24 0752 16     Temp 07/13/24 0752 97.9 F (36.6 C)     Temp Source 07/13/24 0752 Oral     SpO2 07/13/24 0752 99 %     Weight 07/13/24 0753 250 lb (113.4 kg)     Height --      Head Circumference --      Peak Flow --      Pain Score 07/13/24 0753 0     Pain Loc --      Pain Education --      Exclude from Growth Chart --     Most recent vital signs: Vitals:   07/13/24 0752  BP: 117/77  Pulse: (!) 57  Resp: 16  Temp: 97.9 F (36.6 C)  SpO2: 99%     General: Awake, no distress.  HEENT: Normocephalic, atraumatic, oropharyngeal exam unremarkable CV:  Good peripheral perfusion. RRR, RP 2+ Resp:  Normal effort. CTAB Abd:  No distention. Nontender to deep  palpation throughout   ED Results / Procedures / Treatments   Labs (all labs ordered are listed, but only abnormal results are displayed) Labs Reviewed  RESP PANEL BY RT-PCR (RSV, FLU A&B, COVID)  RVPGX2     EKG  See ED course below.    RADIOLOGY N/a    PROCEDURES:  Critical Care performed: No  Procedures   MEDICATIONS ORDERED IN ED: Medications - No data to display   IMPRESSION / MDM / ASSESSMENT AND PLAN / ED COURSE  I reviewed the triage vital signs and the nursing notes.                              DDX/MDM/AP: Differential diagnosis includes, but is not limited to, viral syndrome/URI with likely concomitant costochondritis, doubt pneumonia given only 2 days of cough, do not suspect strep throat given overall constellation of symptoms and reassuring exam here.  Also doubt bronchitis given brief timeframe.  Highly doubt PE given clinical presentation.  Plan: - viral swab - offered  screening cxr which pt declined - ecg  Patient's presentation is most consistent with acute complicated illness / injury requiring diagnostic workup.  ED course below.  EKG nonischemic.  COVID/flu/RSV negative.  Presentation overall most consistent with viral syndrome/URI with likely mild costochondritis.  No evidence of acute pathology at this time.  Will Rx Tessalon  Perles and plan for expectant management and PMD follow-up.  Recommend Tylenol , Motrin  as needed.  ED return precautions in place.  Patient agrees with plan.  Remains very well-appearing and hemodynamically stable here in emergency department.  Clinical Course as of 07/13/24 0850  Wed Jul 13, 2024  0838 Ecg = sinus rhythm, rate 60, no gross ST elevation or depression, no significant repolarization abnormality, normal axis, normal intervals.  No evidence of ischemia nor clinically significant arrhythmia on my interpretation. [MM]  0849 Viral swab neg [MM]    Clinical Course User Index [MM] Clarine Ozell LABOR, MD      FINAL CLINICAL IMPRESSION(S) / ED DIAGNOSES   Final diagnoses:  Upper respiratory tract infection, unspecified type  Atypical chest pain     Rx / DC Orders   ED Discharge Orders          Ordered    benzonatate  (TESSALON ) 100 MG capsule  3 times daily PRN        07/13/24 0849             Note:  This document was prepared using Dragon voice recognition software and may include unintentional dictation errors.   Clarine Ozell LABOR, MD 07/13/24 [phone]  "

## 2024-07-13 NOTE — Discharge Instructions (Addendum)
 Your evaluation in the emergency department was overall reassuring.  I do suspect you have a viral illness that should improve on its own within the next few days.  You tested negative for COVID, influenza, and RSV.  I have prescribed you a new cough medication to use as needed.  He can also use Tylenol  and Motrin  as needed for any mild discomfort or fevers.  Please do follow-up with your primary care provider for reevaluation, and return to the emergency department with any new or worsening symptoms.

## 2024-07-13 NOTE — ED Triage Notes (Signed)
 C/O cough and sinus congestion x 5 days.  Symptoms initially started with sore throat, which has resolved.  AAOx3. Skin warm and dry. NAD
